# Patient Record
Sex: Male | Born: 1935
Health system: Southern US, Community
[De-identification: ages and names within clinical notes are randomized; demographics above are authoritative.]

## PROBLEM LIST (undated history)

## (undated) DIAGNOSIS — I350 Nonrheumatic aortic (valve) stenosis: Secondary | ICD-10-CM

## (undated) DIAGNOSIS — N529 Male erectile dysfunction, unspecified: Secondary | ICD-10-CM

## (undated) DIAGNOSIS — R55 Syncope and collapse: Secondary | ICD-10-CM

## (undated) DIAGNOSIS — R0609 Other forms of dyspnea: Secondary | ICD-10-CM

## (undated) DIAGNOSIS — Z961 Presence of intraocular lens: Secondary | ICD-10-CM

## (undated) DIAGNOSIS — M1990 Unspecified osteoarthritis, unspecified site: Secondary | ICD-10-CM

## (undated) DIAGNOSIS — R7881 Bacteremia: Secondary | ICD-10-CM

## (undated) DIAGNOSIS — Z8679 Personal history of other diseases of the circulatory system: Secondary | ICD-10-CM

## (undated) DIAGNOSIS — R972 Elevated prostate specific antigen [PSA]: Secondary | ICD-10-CM

## (undated) DIAGNOSIS — I469 Cardiac arrest, cause unspecified: Secondary | ICD-10-CM

## (undated) DIAGNOSIS — H3320 Serous retinal detachment, unspecified eye: Secondary | ICD-10-CM

## (undated) DIAGNOSIS — R9430 Abnormal result of cardiovascular function study, unspecified: Secondary | ICD-10-CM

## (undated) DIAGNOSIS — N401 Enlarged prostate with lower urinary tract symptoms: Secondary | ICD-10-CM

## (undated) DIAGNOSIS — I482 Chronic atrial fibrillation, unspecified: Secondary | ICD-10-CM

## (undated) DIAGNOSIS — I499 Cardiac arrhythmia, unspecified: Secondary | ICD-10-CM

## (undated) DIAGNOSIS — K769 Liver disease, unspecified: Secondary | ICD-10-CM

## (undated) DIAGNOSIS — Z8739 Personal history of other diseases of the musculoskeletal system and connective tissue: Secondary | ICD-10-CM

## (undated) DIAGNOSIS — I10 Essential (primary) hypertension: Secondary | ICD-10-CM

## (undated) DIAGNOSIS — B1920 Unspecified viral hepatitis C without hepatic coma: Secondary | ICD-10-CM

## (undated) DIAGNOSIS — H401190 Primary open-angle glaucoma, unspecified eye, stage unspecified: Secondary | ICD-10-CM

## (undated) DIAGNOSIS — I8290 Acute embolism and thrombosis of unspecified vein: Secondary | ICD-10-CM

## (undated) DIAGNOSIS — H35342 Macular cyst, hole, or pseudohole, left eye: Secondary | ICD-10-CM

## (undated) DIAGNOSIS — R7303 Prediabetes: Secondary | ICD-10-CM

## (undated) DIAGNOSIS — N400 Enlarged prostate without lower urinary tract symptoms: Secondary | ICD-10-CM

## (undated) DIAGNOSIS — I5022 Chronic systolic (congestive) heart failure: Secondary | ICD-10-CM

## (undated) DIAGNOSIS — E785 Hyperlipidemia, unspecified: Secondary | ICD-10-CM

## (undated) DIAGNOSIS — I214 Non-ST elevation (NSTEMI) myocardial infarction: Secondary | ICD-10-CM

## (undated) DIAGNOSIS — H269 Unspecified cataract: Secondary | ICD-10-CM

## (undated) DIAGNOSIS — Z952 Presence of prosthetic heart valve: Secondary | ICD-10-CM

## (undated) DIAGNOSIS — K219 Gastro-esophageal reflux disease without esophagitis: Secondary | ICD-10-CM

## (undated) DIAGNOSIS — S32000A Wedge compression fracture of unspecified lumbar vertebra, initial encounter for closed fracture: Secondary | ICD-10-CM

## (undated) DIAGNOSIS — I2510 Atherosclerotic heart disease of native coronary artery without angina pectoris: Secondary | ICD-10-CM

## (undated) DIAGNOSIS — R6889 Other general symptoms and signs: Secondary | ICD-10-CM

## (undated) DIAGNOSIS — Z95810 Presence of automatic (implantable) cardiac defibrillator: Secondary | ICD-10-CM

## (undated) DIAGNOSIS — N138 Other obstructive and reflux uropathy: Secondary | ICD-10-CM

## (undated) DIAGNOSIS — I255 Ischemic cardiomyopathy: Secondary | ICD-10-CM

## (undated) HISTORY — PX: CARDIAC CATHETERIZATION: SHX172

## (undated) HISTORY — PX: INSERT / REPLACE / REMOVE PACEMAKER: SUR710

## (undated) HISTORY — DX: Serous retinal detachment, unspecified eye: H33.20

## (undated) HISTORY — DX: Prediabetes: R73.03

## (undated) HISTORY — PX: CORONARY ARTERY BYPASS GRAFT: SHX141

## (undated) HISTORY — PX: CHOLECYSTECTOMY: SHX55

## (undated) HISTORY — DX: Gastro-esophageal reflux disease without esophagitis: K21.9

## (undated) HISTORY — DX: Primary open-angle glaucoma, unspecified eye, stage unspecified: H40.1190

## (undated) HISTORY — DX: Non-ST elevation (NSTEMI) myocardial infarction: I21.4

## (undated) HISTORY — PX: AV NODE ABLATION: EP1193

## (undated) HISTORY — PX: CORONARY ANGIOPLASTY WITH STENT PLACEMENT: SHX49

## (undated) HISTORY — DX: Presence of intraocular lens: Z96.1

## (undated) HISTORY — DX: Personal history of other diseases of the musculoskeletal system and connective tissue: Z87.39

## (undated) HISTORY — DX: Benign prostatic hyperplasia with lower urinary tract symptoms: N40.1

## (undated) HISTORY — DX: Unspecified osteoarthritis, unspecified site: M19.90

## (undated) HISTORY — DX: Unspecified viral hepatitis C without hepatic coma: B19.20

## (undated) HISTORY — DX: Nonrheumatic aortic (valve) stenosis: I35.0

## (undated) HISTORY — DX: Personal history of other diseases of the circulatory system: Z86.79

## (undated) HISTORY — DX: Cardiac arrhythmia, unspecified: I49.9

## (undated) HISTORY — DX: Elevated prostate specific antigen (PSA): R97.20

## (undated) HISTORY — DX: Unspecified cataract: H26.9

## (undated) HISTORY — DX: Male erectile dysfunction, unspecified: N52.9

## (undated) HISTORY — DX: Other general symptoms and signs: R68.89

## (undated) HISTORY — DX: Chronic systolic (congestive) heart failure: I50.22

## (undated) HISTORY — DX: Acute embolism and thrombosis of unspecified vein: I82.90

## (undated) HISTORY — PX: YAG LASER APPLICATION: SHX6189

## (undated) HISTORY — DX: Essential (primary) hypertension: I10

## (undated) HISTORY — DX: Other obstructive and reflux uropathy: N13.8

## (undated) HISTORY — PX: SKIN BIOPSY: SHX1

## (undated) HISTORY — DX: Bacteremia: R78.81

## (undated) HISTORY — DX: Macular cyst, hole, or pseudohole, left eye: H35.342

## (undated) HISTORY — DX: Wedge compression fracture of unspecified lumbar vertebra, initial encounter for closed fracture: S32.000A

## (undated) HISTORY — DX: Syncope and collapse: R55

## (undated) HISTORY — DX: Other forms of dyspnea: R06.09

## (undated) HISTORY — DX: Liver disease, unspecified: K76.9

## (undated) HISTORY — DX: Abnormal result of cardiovascular function study, unspecified: R94.30

## (undated) HISTORY — DX: Atherosclerotic heart disease of native coronary artery without angina pectoris: I25.10

## (undated) HISTORY — DX: Hyperlipidemia, unspecified: E78.5

## (undated) HISTORY — DX: Cardiac arrest, cause unspecified: I46.9

## (undated) HISTORY — PX: CATARACT EXTRACTION: SUR2

---

## 2011-06-03 DIAGNOSIS — I4891 Unspecified atrial fibrillation: Secondary | ICD-10-CM | POA: Insufficient documentation

## 2011-07-18 DIAGNOSIS — N401 Enlarged prostate with lower urinary tract symptoms: Secondary | ICD-10-CM | POA: Insufficient documentation

## 2011-07-18 DIAGNOSIS — R972 Elevated prostate specific antigen [PSA]: Secondary | ICD-10-CM | POA: Insufficient documentation

## 2012-02-11 DIAGNOSIS — I472 Ventricular tachycardia: Secondary | ICD-10-CM | POA: Insufficient documentation

## 2012-02-11 DIAGNOSIS — E785 Hyperlipidemia, unspecified: Secondary | ICD-10-CM | POA: Insufficient documentation

## 2012-02-11 DIAGNOSIS — I501 Left ventricular failure: Secondary | ICD-10-CM | POA: Insufficient documentation

## 2012-02-11 DIAGNOSIS — B182 Chronic viral hepatitis C: Secondary | ICD-10-CM | POA: Insufficient documentation

## 2012-02-11 DIAGNOSIS — I214 Non-ST elevation (NSTEMI) myocardial infarction: Secondary | ICD-10-CM | POA: Insufficient documentation

## 2012-03-01 DIAGNOSIS — H3320 Serous retinal detachment, unspecified eye: Secondary | ICD-10-CM | POA: Insufficient documentation

## 2012-04-04 DIAGNOSIS — N529 Male erectile dysfunction, unspecified: Secondary | ICD-10-CM | POA: Insufficient documentation

## 2012-11-07 DIAGNOSIS — Z7901 Long term (current) use of anticoagulants: Secondary | ICD-10-CM | POA: Insufficient documentation

## 2013-05-02 DIAGNOSIS — H401133 Primary open-angle glaucoma, bilateral, severe stage: Secondary | ICD-10-CM | POA: Insufficient documentation

## 2014-08-05 DIAGNOSIS — Z9889 Other specified postprocedural states: Secondary | ICD-10-CM | POA: Insufficient documentation

## 2015-04-01 DIAGNOSIS — R7881 Bacteremia: Secondary | ICD-10-CM | POA: Insufficient documentation

## 2015-08-12 DIAGNOSIS — T827XXA Infection and inflammatory reaction due to other cardiac and vascular devices, implants and grafts, initial encounter: Secondary | ICD-10-CM | POA: Insufficient documentation

## 2015-10-21 DIAGNOSIS — M4647 Discitis, unspecified, lumbosacral region: Secondary | ICD-10-CM | POA: Insufficient documentation

## 2015-10-21 DIAGNOSIS — M4856XA Collapsed vertebra, not elsewhere classified, lumbar region, initial encounter for fracture: Secondary | ICD-10-CM | POA: Insufficient documentation

## 2017-05-18 DIAGNOSIS — I2510 Atherosclerotic heart disease of native coronary artery without angina pectoris: Secondary | ICD-10-CM | POA: Insufficient documentation

## 2017-08-02 DIAGNOSIS — H35342 Macular cyst, hole, or pseudohole, left eye: Secondary | ICD-10-CM | POA: Insufficient documentation

## 2017-08-02 DIAGNOSIS — Z8669 Personal history of other diseases of the nervous system and sense organs: Secondary | ICD-10-CM | POA: Insufficient documentation

## 2017-08-02 DIAGNOSIS — Z961 Presence of intraocular lens: Secondary | ICD-10-CM | POA: Insufficient documentation

## 2017-12-13 DIAGNOSIS — H401133 Primary open-angle glaucoma, bilateral, severe stage: Secondary | ICD-10-CM | POA: Diagnosis not present

## 2018-01-08 DIAGNOSIS — I472 Ventricular tachycardia: Secondary | ICD-10-CM | POA: Diagnosis not present

## 2018-01-26 DIAGNOSIS — Z4502 Encounter for adjustment and management of automatic implantable cardiac defibrillator: Secondary | ICD-10-CM | POA: Diagnosis not present

## 2018-01-26 DIAGNOSIS — I255 Ischemic cardiomyopathy: Secondary | ICD-10-CM | POA: Diagnosis not present

## 2018-03-13 DIAGNOSIS — I2510 Atherosclerotic heart disease of native coronary artery without angina pectoris: Secondary | ICD-10-CM | POA: Diagnosis not present

## 2018-03-13 DIAGNOSIS — I482 Chronic atrial fibrillation: Secondary | ICD-10-CM | POA: Diagnosis not present

## 2018-03-13 DIAGNOSIS — I350 Nonrheumatic aortic (valve) stenosis: Secondary | ICD-10-CM | POA: Diagnosis not present

## 2018-03-13 DIAGNOSIS — Z951 Presence of aortocoronary bypass graft: Secondary | ICD-10-CM | POA: Diagnosis not present

## 2018-03-13 DIAGNOSIS — Z5181 Encounter for therapeutic drug level monitoring: Secondary | ICD-10-CM | POA: Diagnosis not present

## 2018-03-13 DIAGNOSIS — Z7901 Long term (current) use of anticoagulants: Secondary | ICD-10-CM | POA: Diagnosis not present

## 2018-03-21 DIAGNOSIS — H401133 Primary open-angle glaucoma, bilateral, severe stage: Secondary | ICD-10-CM | POA: Diagnosis not present

## 2018-04-09 DIAGNOSIS — I255 Ischemic cardiomyopathy: Secondary | ICD-10-CM | POA: Diagnosis not present

## 2018-04-09 DIAGNOSIS — Z4502 Encounter for adjustment and management of automatic implantable cardiac defibrillator: Secondary | ICD-10-CM | POA: Diagnosis not present

## 2018-04-18 DIAGNOSIS — I4891 Unspecified atrial fibrillation: Secondary | ICD-10-CM | POA: Diagnosis not present

## 2018-04-18 DIAGNOSIS — Z7901 Long term (current) use of anticoagulants: Secondary | ICD-10-CM | POA: Diagnosis not present

## 2018-04-18 DIAGNOSIS — I739 Peripheral vascular disease, unspecified: Secondary | ICD-10-CM | POA: Diagnosis not present

## 2018-04-18 DIAGNOSIS — H409 Unspecified glaucoma: Secondary | ICD-10-CM | POA: Diagnosis not present

## 2018-04-18 DIAGNOSIS — I252 Old myocardial infarction: Secondary | ICD-10-CM | POA: Diagnosis not present

## 2018-04-18 DIAGNOSIS — I110 Hypertensive heart disease with heart failure: Secondary | ICD-10-CM | POA: Diagnosis not present

## 2018-04-18 DIAGNOSIS — I509 Heart failure, unspecified: Secondary | ICD-10-CM | POA: Diagnosis not present

## 2018-04-18 DIAGNOSIS — I2510 Atherosclerotic heart disease of native coronary artery without angina pectoris: Secondary | ICD-10-CM | POA: Diagnosis not present

## 2018-04-18 DIAGNOSIS — R69 Illness, unspecified: Secondary | ICD-10-CM | POA: Diagnosis not present

## 2018-04-18 DIAGNOSIS — E785 Hyperlipidemia, unspecified: Secondary | ICD-10-CM | POA: Diagnosis not present

## 2018-06-05 DIAGNOSIS — H52209 Unspecified astigmatism, unspecified eye: Secondary | ICD-10-CM | POA: Diagnosis not present

## 2018-06-05 DIAGNOSIS — H5213 Myopia, bilateral: Secondary | ICD-10-CM | POA: Diagnosis not present

## 2018-06-05 DIAGNOSIS — H31093 Other chorioretinal scars, bilateral: Secondary | ICD-10-CM | POA: Diagnosis not present

## 2018-06-05 DIAGNOSIS — H2511 Age-related nuclear cataract, right eye: Secondary | ICD-10-CM | POA: Diagnosis not present

## 2018-06-05 DIAGNOSIS — H524 Presbyopia: Secondary | ICD-10-CM | POA: Diagnosis not present

## 2018-06-05 DIAGNOSIS — Z8669 Personal history of other diseases of the nervous system and sense organs: Secondary | ICD-10-CM | POA: Diagnosis not present

## 2018-06-05 DIAGNOSIS — R69 Illness, unspecified: Secondary | ICD-10-CM | POA: Diagnosis not present

## 2018-06-05 DIAGNOSIS — Z961 Presence of intraocular lens: Secondary | ICD-10-CM | POA: Diagnosis not present

## 2018-06-05 DIAGNOSIS — H401133 Primary open-angle glaucoma, bilateral, severe stage: Secondary | ICD-10-CM | POA: Diagnosis not present

## 2018-06-07 DIAGNOSIS — Z951 Presence of aortocoronary bypass graft: Secondary | ICD-10-CM | POA: Diagnosis not present

## 2018-06-07 DIAGNOSIS — I4891 Unspecified atrial fibrillation: Secondary | ICD-10-CM | POA: Diagnosis not present

## 2018-06-07 DIAGNOSIS — I5022 Chronic systolic (congestive) heart failure: Secondary | ICD-10-CM | POA: Diagnosis not present

## 2018-06-07 DIAGNOSIS — I110 Hypertensive heart disease with heart failure: Secondary | ICD-10-CM | POA: Diagnosis not present

## 2018-06-07 DIAGNOSIS — E785 Hyperlipidemia, unspecified: Secondary | ICD-10-CM | POA: Diagnosis not present

## 2018-06-07 DIAGNOSIS — I2510 Atherosclerotic heart disease of native coronary artery without angina pectoris: Secondary | ICD-10-CM | POA: Diagnosis not present

## 2018-06-07 DIAGNOSIS — I350 Nonrheumatic aortic (valve) stenosis: Secondary | ICD-10-CM | POA: Diagnosis not present

## 2018-07-02 DIAGNOSIS — I255 Ischemic cardiomyopathy: Secondary | ICD-10-CM | POA: Diagnosis not present

## 2018-07-02 DIAGNOSIS — Z4502 Encounter for adjustment and management of automatic implantable cardiac defibrillator: Secondary | ICD-10-CM | POA: Diagnosis not present

## 2018-07-06 DIAGNOSIS — R69 Illness, unspecified: Secondary | ICD-10-CM | POA: Diagnosis not present

## 2018-07-24 DIAGNOSIS — Z7901 Long term (current) use of anticoagulants: Secondary | ICD-10-CM | POA: Diagnosis not present

## 2018-07-24 DIAGNOSIS — I4811 Longstanding persistent atrial fibrillation: Secondary | ICD-10-CM | POA: Diagnosis not present

## 2018-07-24 DIAGNOSIS — Z5181 Encounter for therapeutic drug level monitoring: Secondary | ICD-10-CM | POA: Diagnosis not present

## 2018-08-20 DIAGNOSIS — I4811 Longstanding persistent atrial fibrillation: Secondary | ICD-10-CM | POA: Diagnosis not present

## 2018-08-20 DIAGNOSIS — Z5181 Encounter for therapeutic drug level monitoring: Secondary | ICD-10-CM | POA: Diagnosis not present

## 2018-08-20 DIAGNOSIS — Z7901 Long term (current) use of anticoagulants: Secondary | ICD-10-CM | POA: Diagnosis not present

## 2018-08-22 DIAGNOSIS — H401133 Primary open-angle glaucoma, bilateral, severe stage: Secondary | ICD-10-CM | POA: Diagnosis not present

## 2018-08-22 DIAGNOSIS — Z7901 Long term (current) use of anticoagulants: Secondary | ICD-10-CM | POA: Diagnosis not present

## 2018-08-22 DIAGNOSIS — Z5181 Encounter for therapeutic drug level monitoring: Secondary | ICD-10-CM | POA: Diagnosis not present

## 2018-08-22 DIAGNOSIS — I4811 Longstanding persistent atrial fibrillation: Secondary | ICD-10-CM | POA: Diagnosis not present

## 2018-08-28 DIAGNOSIS — Z7901 Long term (current) use of anticoagulants: Secondary | ICD-10-CM | POA: Diagnosis not present

## 2018-08-28 DIAGNOSIS — Z5181 Encounter for therapeutic drug level monitoring: Secondary | ICD-10-CM | POA: Diagnosis not present

## 2018-08-28 DIAGNOSIS — I4811 Longstanding persistent atrial fibrillation: Secondary | ICD-10-CM | POA: Diagnosis not present

## 2018-09-07 DIAGNOSIS — Z5181 Encounter for therapeutic drug level monitoring: Secondary | ICD-10-CM | POA: Diagnosis not present

## 2018-09-07 DIAGNOSIS — I4811 Longstanding persistent atrial fibrillation: Secondary | ICD-10-CM | POA: Diagnosis not present

## 2018-09-07 DIAGNOSIS — Z7901 Long term (current) use of anticoagulants: Secondary | ICD-10-CM | POA: Diagnosis not present

## 2018-09-18 DIAGNOSIS — R05 Cough: Secondary | ICD-10-CM | POA: Diagnosis not present

## 2018-09-18 DIAGNOSIS — I4811 Longstanding persistent atrial fibrillation: Secondary | ICD-10-CM | POA: Diagnosis not present

## 2018-09-18 DIAGNOSIS — M4856XS Collapsed vertebra, not elsewhere classified, lumbar region, sequela of fracture: Secondary | ICD-10-CM | POA: Diagnosis not present

## 2018-09-18 DIAGNOSIS — I2510 Atherosclerotic heart disease of native coronary artery without angina pectoris: Secondary | ICD-10-CM | POA: Diagnosis not present

## 2018-09-18 DIAGNOSIS — Z7901 Long term (current) use of anticoagulants: Secondary | ICD-10-CM | POA: Diagnosis not present

## 2018-09-18 DIAGNOSIS — I4891 Unspecified atrial fibrillation: Secondary | ICD-10-CM | POA: Diagnosis not present

## 2018-09-18 DIAGNOSIS — Z5181 Encounter for therapeutic drug level monitoring: Secondary | ICD-10-CM | POA: Diagnosis not present

## 2018-09-18 DIAGNOSIS — Z0000 Encounter for general adult medical examination without abnormal findings: Secondary | ICD-10-CM | POA: Diagnosis not present

## 2018-09-18 DIAGNOSIS — R413 Other amnesia: Secondary | ICD-10-CM | POA: Diagnosis not present

## 2018-09-18 DIAGNOSIS — I350 Nonrheumatic aortic (valve) stenosis: Secondary | ICD-10-CM | POA: Diagnosis not present

## 2018-10-01 DIAGNOSIS — Z7901 Long term (current) use of anticoagulants: Secondary | ICD-10-CM | POA: Diagnosis not present

## 2018-10-01 DIAGNOSIS — I4891 Unspecified atrial fibrillation: Secondary | ICD-10-CM | POA: Diagnosis not present

## 2018-10-01 DIAGNOSIS — I255 Ischemic cardiomyopathy: Secondary | ICD-10-CM | POA: Diagnosis not present

## 2018-10-01 DIAGNOSIS — Z4502 Encounter for adjustment and management of automatic implantable cardiac defibrillator: Secondary | ICD-10-CM | POA: Diagnosis not present

## 2018-10-01 DIAGNOSIS — Z5181 Encounter for therapeutic drug level monitoring: Secondary | ICD-10-CM | POA: Diagnosis not present

## 2018-10-03 DIAGNOSIS — R05 Cough: Secondary | ICD-10-CM | POA: Diagnosis not present

## 2018-10-30 DIAGNOSIS — Z7901 Long term (current) use of anticoagulants: Secondary | ICD-10-CM | POA: Diagnosis not present

## 2018-10-30 DIAGNOSIS — Z5181 Encounter for therapeutic drug level monitoring: Secondary | ICD-10-CM | POA: Diagnosis not present

## 2018-10-30 DIAGNOSIS — I4891 Unspecified atrial fibrillation: Secondary | ICD-10-CM | POA: Diagnosis not present

## 2018-11-05 ENCOUNTER — Encounter (INDEPENDENT_AMBULATORY_CARE_PROVIDER_SITE_OTHER): Payer: Self-pay

## 2018-11-05 ENCOUNTER — Encounter: Payer: Self-pay | Admitting: Cardiovascular Disease

## 2018-11-05 ENCOUNTER — Ambulatory Visit (INDEPENDENT_AMBULATORY_CARE_PROVIDER_SITE_OTHER): Payer: Medicare HMO | Admitting: Cardiovascular Disease

## 2018-11-05 VITALS — BP 132/76 | HR 92 | Ht 66.0 in | Wt 178.4 lb

## 2018-11-05 DIAGNOSIS — I350 Nonrheumatic aortic (valve) stenosis: Secondary | ICD-10-CM | POA: Diagnosis not present

## 2018-11-05 NOTE — Progress Notes (Signed)
Chief Complaint  Patient presents with  . New Patient (Initial Visit)    Severe aortic stenosis   History of Present Illness: 84 yo male with history of arthritis, ischemic cardiomyopathy, CAD, GERD, HTN, hyperlipidemia, chronic systolic CHF, paroxysmal atrial fibrillation and severe aortic stenosis here today as a new patient for further discussion regarding his aortic stenosis and possible TAVR. He is known to have severe aortic stenosis. He underwent TAVR workup at [facility] in 2015 but ultimately he did not proceed with TAVR as he was told it may not improve his symptoms. He has been followed in the [place] in [place], [place] since then. Echo August 2019 with LVEF=35-40%. The aortic valve leaflets were thickened and calcified. Mean gradient 27 mmHg. AVA 0.44 cm2, Dimensionless index 0.15. He is felt to have low flow, low gradient aortic stenosis. He is known to have CAD. He underwent 4V CABG in 1994 (LIMA to LAD, SVG to ramus, SVG to Diagonal, SVG to PDA). PCI of the first obtuse marginal in 2014 with placement of a 2.5 x 16 mm Promus drug eluting stent. Most recent cardiac cath February 2018 with 4/4 patent grafts with high grade disease in the SVG to the ramus intermediate treated with a drug eluting stent. He has paroxysmal atrial fibrillation and has undergone ablation. He has had non-sustained ventricular tachycardia and an ICD is in place. This was replaced in 2016 due to enterococcal bacteremia. He has chronic systolic CHF, HTN, hyperlipidemia and GERD. He has been followed at [facility] by Dr. Adella Hare and was last seen there in October 2019. He was told then that it was not clear if a valve replacement would improve his symptoms so he elected not to proceed.   He tells me today that he has no chest pain. He has dyspnea with minimal exertion. He has dizziness and progressive fatigue with no near syncope or syncope. No lower extremity edema or recent weight gain. He  goes to the dentist every six months. No active dental issues. He lives in a house in [place] with his wife.   Primary Care Physician: Day, Pernell Dupre, MD  Primary Cardiologist: Dr. [facility]) TAVR Cardiologist: Dr. Adella Hare    Past Medical History:  Diagnosis Date  . Aortic stenosis   . Arrhythmia    paroxysmal atrial fibrillation  . Arthritis   . Bacteremia   . Cardiac arrest (HCC)   . Cardiac LV ejection fraction 30-35%   . Cataract   . Compression of lumbar vertebra (HCC)   . Coronary artery disease   . Dyspnea on exertion   . ED (erectile dysfunction)   . Elevated prostate specific antigen (PSA)   . GERD (gastroesophageal reflux disease)   . Glaucoma primary, open angle    BOTH EYES  . H/O ventricular tachycardia   . Hepatitis C virus infection   . History of discitis   . Hyperlipidemia   . Hypertension   . Hypertrophy of prostate with urinary obstruction    AND LOWER URINARY TRACT SYMPTOMS  . Hypokinesis   . Liver disease   . Macular hole of left eye   . Non-ST elevation MI (NSTEMI) (HCC)   . Pre-diabetes   . Pseudophakia of left eye   . Retinal detachment   . Syncope   . Systolic CHF, chronic (HCC)   . Thrombus    laa    Past Surgical History:  Procedure Laterality Date  . AV NODE ABLATION    .  CARDIAC CATHETERIZATION    . CATARACT EXTRACTION    . CHOLECYSTECTOMY    . CORONARY ANGIOPLASTY WITH STENT PLACEMENT    . CORONARY ARTERY BYPASS GRAFT    . INSERT / REPLACE / REMOVE PACEMAKER    . SKIN BIOPSY    . YAG LASER APPLICATION      Current Outpatient Medications  Medication Sig Dispense Refill  . aspirin EC 81 MG tablet Take 81 mg by mouth daily.    . brimonidine (ALPHAGAN) 0.2 % ophthalmic solution Place 1 drop into both eyes 2 (two) times daily.    . brinzolamide (AZOPT) 1 % ophthalmic suspension Place 1 drop into both eyes 3 (three) times daily.    . carvedilol (COREG) 25 MG tablet Take 2.5 mg by mouth 2 (two) times daily with a  meal.    . Cholecalciferol (VITAMIN D) 125 MCG (5000 UT) CAPS Take 1 capsule by mouth every other day.    . dorzolamide (TRUSOPT) 2 % ophthalmic solution Place 1 drop into both eyes 3 (three) times daily.    . famotidine (PEPCID) 20 MG tablet Take 20 mg by mouth daily.    . furosemide (LASIX) 40 MG tablet Take 40 mg by mouth daily.    Marland Kitchen latanoprost (XALATAN) 0.005 % ophthalmic solution Place 1 drop into both eyes at bedtime.    Marland Kitchen lisinopril (PRINIVIL,ZESTRIL) 40 MG tablet Take 40 mg by mouth daily.    . magnesium gluconate (MAGONATE) 500 MG tablet Take 500 mg by mouth every other day.    . Multiple Vitamin (MULTIVITAMIN) tablet Take 1 tablet by mouth daily.    . nitroGLYCERIN (NITROSTAT) 0.4 MG SL tablet Place 0.4 mg under the tongue every 5 (five) minutes as needed for chest pain.    . pravastatin (PRAVACHOL) 40 MG tablet Take 40 mg by mouth at bedtime.    . ranolazine (RANEXA) 500 MG 12 hr tablet Take 500 mg by mouth 2 (two) times daily.    Marland Kitchen warfarin (COUMADIN) 5 MG tablet 5 mg. TAKE  ONE TABLET BY MOUTH EVERY EVENING EXCEPT, ON MONDAY, [facility] AND Friday TAKE AS DIRECTED     No current facility-administered medications for this visit.     Allergies  Allergen Reactions  . Anesthetics, Ester Other (See Comments)    GI UPSET    Social History   Socioeconomic History  . Marital status: Married    Spouse name: Not on file  . Number of children: Not on file  . Years of education: Not on file  . Highest education level: Not on file  Occupational History  . Occupation: Retired-accountant  Social Needs  . Financial resource strain: Not on file  . Food insecurity:    Worry: Not on file    Inability: Not on file  . Transportation needs:    Medical: Not on file    Non-medical: Not on file  Tobacco Use  . Smoking status: Former Smoker    Packs/day: 2.00    Years: 40.00    Pack years: 80.00    Types: Cigarettes    Last attempt to quit: 09/06/1983    Years since quitting: 35.1    . Smokeless tobacco: Never Used  Substance and Sexual Activity  . Alcohol use: Yes    Alcohol/week: 2.0 standard drinks    Types: 1 Glasses of wine, 1 Cans of beer per week  . Drug use: Not on file  . Sexual activity: Not on file  Lifestyle  . Physical activity:  Days per week: Not on file    Minutes per session: Not on file  . Stress: Not on file  Relationships  . Social connections:    Talks on phone: Not on file    Gets together: Not on file    Attends religious service: Not on file    Active member of club or organization: Not on file    Attends meetings of clubs or organizations: Not on file    Relationship status: Not on file  . Intimate partner violence:    Fear of current or ex partner: Not on file    Emotionally abused: Not on file    Physically abused: Not on file    Forced sexual activity: Not on file  Other Topics Concern  . Not on file  Social History Narrative  . Not on file    Family History  Problem Relation Age of Onset  . Cancer - Other Sister        BREAST    Review of Systems:  As stated in the HPI and otherwise negative.   BP 132/76   Pulse 92   Ht  (1.676 m)   Wt 80.9 kg   SpO2 98%   BMI 28.79 kg/m   Physical Examination: General: Well developed, well nourished, NAD  HEENT: OP clear, mucus membranes moist  SKIN: warm, dry. No rashes. Neuro: No focal deficits  Musculoskeletal: Muscle strength 5/5 all ext  Psychiatric: Mood and affect normal  Neck: No JVD, no carotid bruits, no thyromegaly, no lymphadenopathy.  Lungs:Clear bilaterally, no wheezes, rhonci, crackles Cardiovascular: Regular rate and rhythm. Loud, harsh systolic murmur at the RUSB Abdomen:Soft. Bowel sounds present. Non-tender.  Extremities: No lower extremity edema. Pulses are 2 + in the bilateral DP/PT.  Echo August 2019:  LVEF=35-40%.  Mildly dilated LV BAE Severe AS (mean gradient 27 mmHg with DVI 0.15, AVA 0.44cm2.   EKG:  EKG is ordered today. The ekg  ordered today demonstrates v paced  Recent Labs: No results found for requested labs within last 8760 hours.   Lipid Panel No results found for: CHOL, TRIG, HDL, CHOLHDL, VLDL, LDLCALC, LDLDIRECT   Wt Readings from Last 3 Encounters:  11/05/18 80.9 kg     Other studies Reviewed: Additional studies/ records that were reviewed today include: echo images, records from the [place] and [facility].  Review of the above records demonstrates: severe low flow, lwo gradient AS   Assessment and Plan:   1. Severe aortic stenosis: He has severe, stage D aortic valve stenosis. I have personally reviewed the echo images. The aortic valve is thickened, calcified with limited leaflet mobility. His mean gradient is only 27 mmHg but the valve appears to be severely diseased. The dimensionless indexI is 0.15 and the AVA is 0.44 cm2. I think he has low flow, low gradient severe aortic stenosis. I think he would benefit from AVR. Given advanced age, he is not a good candidate for conventional AVR by surgical approach. I think he may be a good candidate for TAVR.   STS Risk score:  Risk of Mortality: 5.407% Renal Failure: 6.472% Permanent Stroke: 1.923% Prolonged Ventilation: 16.937% DSW Infection: 0.263% Reoperation: 6.148% Morbidity or Mortality: 23.504% Short Length of Stay: 16.129% Long Length of Stay: 12.995%   I have reviewed the natural history of aortic stenosis with the patient and their family members  who are present today. We have discussed the limitations of medical therapy and the poor prognosis associated with symptomatic aortic  stenosis. We have reviewed potential treatment options, including palliative medical therapy, conventional surgical aortic valve replacement, and transcatheter aortic valve replacement. We discussed treatment options in the context of the patient's specific comorbid medical conditions.   He would like to discuss this further with his family before proceeding with planning for  TAVR. If he elects to proceed with TAVR workup, he will need a right and left heart catheterization and a same day echo. Risks and benefits of the cath procedure and the TAVR procedure are reviewed with the patient. After the cath, he will have a gated cardiac CT, CTA of the chest/abdomen and pelvis, PFTs, carotid dopplers and PT assessment and will then be referred to see one of the CT surgeons on our TAVR team.    Current medicines are reviewed at length with the patient today.  The patient does not have concerns regarding medicines.  The following changes have been made:  no change  Labs/ tests ordered today include:   Orders Placed This Encounter  Procedures  . EKG 12-Lead     Disposition:   F/U with the valve team. He will call back to set up his cath and echo.    Signed, Verne Carrow, MD 11/05/2018 4:44 PM    [facility] [address] [place], [place], [place]  [address] Phone: [phone]; Fax: [phone]

## 2018-11-05 NOTE — H&P (View-Only) (Signed)
Chief Complaint  Patient presents with  . New Patient (Initial Visit)    Severe aortic stenosis   History of Present Illness: 84 yo male with history of arthritis, ischemic cardiomyopathy, CAD, GERD, HTN, hyperlipidemia, chronic systolic CHF, paroxysmal atrial fibrillation and severe aortic stenosis here today as a new patient for further discussion regarding his aortic stenosis and possible TAVR. He is known to have severe aortic stenosis. He underwent TAVR workup at [facility] in 2015 but ultimately he did not proceed with TAVR as he was told it may not improve his symptoms. He has been followed in the [place] in [place], [place] since then. Echo August 2019 with LVEF=35-40%. The aortic valve leaflets were thickened and calcified. Mean gradient 27 mmHg. AVA 0.44 cm2, Dimensionless index 0.15. He is felt to have low flow, low gradient aortic stenosis. He is known to have CAD. He underwent 4V CABG in 1994 (LIMA to LAD, SVG to ramus, SVG to Diagonal, SVG to PDA). PCI of the first obtuse marginal in 2014 with placement of a 2.5 x 16 mm Promus drug eluting stent. Most recent cardiac cath February 2018 with 4/4 patent grafts with high grade disease in the SVG to the ramus intermediate treated with a drug eluting stent. He has paroxysmal atrial fibrillation and has undergone ablation. He has had non-sustained ventricular tachycardia and an ICD is in place. This was replaced in 2016 due to enterococcal bacteremia. He has chronic systolic CHF, HTN, hyperlipidemia and GERD. He has been followed at [facility] by Dr. Adella Hare and was last seen there in October 2019. He was told then that it was not clear if a valve replacement would improve his symptoms so he elected not to proceed.   He tells me today that he has no chest pain. He has dyspnea with minimal exertion. He has dizziness and progressive fatigue with no near syncope or syncope. No lower extremity edema or recent weight gain. He  goes to the dentist every six months. No active dental issues. He lives in a house in [place] with his wife.   Primary Care Physician: Day, Pernell Dupre, MD  Primary Cardiologist: Dr. [facility]) TAVR Cardiologist: Dr. Adella Hare    Past Medical History:  Diagnosis Date  . Aortic stenosis   . Arrhythmia    paroxysmal atrial fibrillation  . Arthritis   . Bacteremia   . Cardiac arrest (HCC)   . Cardiac LV ejection fraction 30-35%   . Cataract   . Compression of lumbar vertebra (HCC)   . Coronary artery disease   . Dyspnea on exertion   . ED (erectile dysfunction)   . Elevated prostate specific antigen (PSA)   . GERD (gastroesophageal reflux disease)   . Glaucoma primary, open angle    BOTH EYES  . H/O ventricular tachycardia   . Hepatitis C virus infection   . History of discitis   . Hyperlipidemia   . Hypertension   . Hypertrophy of prostate with urinary obstruction    AND LOWER URINARY TRACT SYMPTOMS  . Hypokinesis   . Liver disease   . Macular hole of left eye   . Non-ST elevation MI (NSTEMI) (HCC)   . Pre-diabetes   . Pseudophakia of left eye   . Retinal detachment   . Syncope   . Systolic CHF, chronic (HCC)   . Thrombus    laa    Past Surgical History:  Procedure Laterality Date  . AV NODE ABLATION    .  CARDIAC CATHETERIZATION    . CATARACT EXTRACTION    . CHOLECYSTECTOMY    . CORONARY ANGIOPLASTY WITH STENT PLACEMENT    . CORONARY ARTERY BYPASS GRAFT    . INSERT / REPLACE / REMOVE PACEMAKER    . SKIN BIOPSY    . YAG LASER APPLICATION      Current Outpatient Medications  Medication Sig Dispense Refill  . aspirin EC 81 MG tablet Take 81 mg by mouth daily.    . brimonidine (ALPHAGAN) 0.2 % ophthalmic solution Place 1 drop into both eyes 2 (two) times daily.    . brinzolamide (AZOPT) 1 % ophthalmic suspension Place 1 drop into both eyes 3 (three) times daily.    . carvedilol (COREG) 25 MG tablet Take 2.5 mg by mouth 2 (two) times daily with a  meal.    . Cholecalciferol (VITAMIN D) 125 MCG (5000 UT) CAPS Take 1 capsule by mouth every other day.    . dorzolamide (TRUSOPT) 2 % ophthalmic solution Place 1 drop into both eyes 3 (three) times daily.    . famotidine (PEPCID) 20 MG tablet Take 20 mg by mouth daily.    . furosemide (LASIX) 40 MG tablet Take 40 mg by mouth daily.    . latanoprost (XALATAN) 0.005 % ophthalmic solution Place 1 drop into both eyes at bedtime.    . lisinopril (PRINIVIL,ZESTRIL) 40 MG tablet Take 40 mg by mouth daily.    . magnesium gluconate (MAGONATE) 500 MG tablet Take 500 mg by mouth every other day.    . Multiple Vitamin (MULTIVITAMIN) tablet Take 1 tablet by mouth daily.    . nitroGLYCERIN (NITROSTAT) 0.4 MG SL tablet Place 0.4 mg under the tongue every 5 (five) minutes as needed for chest pain.    . pravastatin (PRAVACHOL) 40 MG tablet Take 40 mg by mouth at bedtime.    . ranolazine (RANEXA) 500 MG 12 hr tablet Take 500 mg by mouth 2 (two) times daily.    . warfarin (COUMADIN) 5 MG tablet 5 mg. TAKE  ONE TABLET BY MOUTH EVERY EVENING EXCEPT, ON MONDAY, WEDNESDAY AND Friday TAKE AS DIRECTED     No current facility-administered medications for this visit.     Allergies  Allergen Reactions  . Anesthetics, Ester Other (See Comments)    GI UPSET    Social History   Socioeconomic History  . Marital status: Married    Spouse name: Not on file  . Number of children: Not on file  . Years of education: Not on file  . Highest education level: Not on file  Occupational History  . Occupation: Retired-accountant  Social Needs  . Financial resource strain: Not on file  . Food insecurity:    Worry: Not on file    Inability: Not on file  . Transportation needs:    Medical: Not on file    Non-medical: Not on file  Tobacco Use  . Smoking status: Former Smoker    Packs/day: 2.00    Years: 40.00    Pack years: 80.00    Types: Cigarettes    Last attempt to quit: 09/06/1983    Years since quitting: 35.1    . Smokeless tobacco: Never Used  Substance and Sexual Activity  . Alcohol use: Yes    Alcohol/week: 2.0 standard drinks    Types: 1 Glasses of wine, 1 Cans of beer per week  . Drug use: Not on file  . Sexual activity: Not on file  Lifestyle  . Physical activity:      Days per week: Not on file    Minutes per session: Not on file  . Stress: Not on file  Relationships  . Social connections:    Talks on phone: Not on file    Gets together: Not on file    Attends religious service: Not on file    Active member of club or organization: Not on file    Attends meetings of clubs or organizations: Not on file    Relationship status: Not on file  . Intimate partner violence:    Fear of current or ex partner: Not on file    Emotionally abused: Not on file    Physically abused: Not on file    Forced sexual activity: Not on file  Other Topics Concern  . Not on file  Social History Narrative  . Not on file    Family History  Problem Relation Age of Onset  . Cancer - Other Sister        BREAST    Review of Systems:  As stated in the HPI and otherwise negative.   BP 132/76   Pulse 92   Ht 5' 6" (1.676 m)   Wt 80.9 kg   SpO2 98%   BMI 28.79 kg/m   Physical Examination: General: Well developed, well nourished, NAD  HEENT: OP clear, mucus membranes moist  SKIN: warm, dry. No rashes. Neuro: No focal deficits  Musculoskeletal: Muscle strength 5/5 all ext  Psychiatric: Mood and affect normal  Neck: No JVD, no carotid bruits, no thyromegaly, no lymphadenopathy.  Lungs:Clear bilaterally, no wheezes, rhonci, crackles Cardiovascular: Regular rate and rhythm. Loud, harsh systolic murmur at the RUSB Abdomen:Soft. Bowel sounds present. Non-tender.  Extremities: No lower extremity edema. Pulses are 2 + in the bilateral DP/PT.  Echo August 2019:  LVEF=35-40%.  Mildly dilated LV BAE Severe AS (mean gradient 27 mmHg with DVI 0.15, AVA 0.44cm2.   EKG:  EKG is ordered today. The ekg  ordered today demonstrates v paced  Recent Labs: No results found for requested labs within last 8760 hours.   Lipid Panel No results found for: CHOL, TRIG, HDL, CHOLHDL, VLDL, LDLCALC, LDLDIRECT   Wt Readings from Last 3 Encounters:  11/05/18 80.9 kg     Other studies Reviewed: Additional studies/ records that were reviewed today include: [facility] images, [facility].  Review of the above records demonstrates: severe low flow, lwo gradient AS   Assessment and Plan:   1. Severe aortic stenosis: He has severe, stage D aortic valve stenosis. I have personally reviewed the echo images. The aortic valve is thickened, calcified with limited leaflet mobility. His mean gradient is only 27 mmHg but the valve appears to be severely diseased. The dimensionless indexI is 0.15 and the AVA is 0.44 cm2. I think he has low flow, low gradient severe aortic stenosis. I think he would benefit from AVR. Given advanced age, he is not a good candidate for conventional AVR by surgical approach. I think he may be a good candidate for TAVR.   STS Risk score:  Risk of Mortality: 5.407% Renal Failure: 6.472% Permanent Stroke: 1.923% Prolonged Ventilation: 16.937% DSW Infection: 0.263% Reoperation: 6.148% Morbidity or Mortality: 23.504% Short Length of Stay: 16.129% Long Length of Stay: 12.995%   I have reviewed the natural history of aortic stenosis with the patient and their family members  who are present today. We have discussed the limitations of medical therapy and the poor prognosis associated with symptomatic aortic   stenosis. We have reviewed potential treatment options, including palliative medical therapy, conventional surgical aortic valve replacement, and transcatheter aortic valve replacement. We discussed treatment options in the context of the patient's specific comorbid medical conditions.   He would like to discuss this further with his family before proceeding with planning for  TAVR. If he elects to proceed with TAVR workup, he will need a right and left heart catheterization and a same day echo. Risks and benefits of the cath procedure and the TAVR procedure are reviewed with the patient. After the cath, he will have a gated cardiac CT, CTA of the chest/abdomen and pelvis, PFTs, carotid dopplers and PT assessment and will then be referred to see one of the CT surgeons on our TAVR team.    Current medicines are reviewed at length with the patient today.  The patient does not have concerns regarding medicines.  The following changes have been made:  no change  Labs/ tests ordered today include:   Orders Placed This Encounter  Procedures  . EKG 12-Lead     Disposition:   F/U with the valve team. He will call back to set up his cath and echo.    Signed, Verne Carrow, MD 11/05/2018 4:44 PM    [facility] [address] [place], [place], [place]  [address] Phone: [phone]; Fax: [phone]

## 2018-11-05 NOTE — Patient Instructions (Signed)
Medication Instructions:  Your physician recommends that you continue on your current medications as directed. Please refer to the Current Medication list given to you today.  If you need a refill on your cardiac medications before your next appointment, please call your pharmacy.   Lab work: none If you have labs (blood work) drawn today and your tests are completely normal, you will receive your results only by: Marland Kitchen MyChart Message (if you have MyChart) OR . A paper copy in the mail If you have any lab test that is abnormal or we need to change your treatment, we will call you to review the results.  Testing/Procedures: none  Follow-Up: Call us if you like to proceed with TAVR evaluation

## 2018-11-08 NOTE — Addendum Note (Signed)
Addended by: Verne Carrow D on: 11/08/2018 10:47 AM   Modules accepted: Orders, SmartSet

## 2018-11-16 DIAGNOSIS — I4891 Unspecified atrial fibrillation: Secondary | ICD-10-CM | POA: Diagnosis not present

## 2018-11-16 DIAGNOSIS — R69 Illness, unspecified: Secondary | ICD-10-CM | POA: Diagnosis not present

## 2018-11-16 DIAGNOSIS — Z5181 Encounter for therapeutic drug level monitoring: Secondary | ICD-10-CM | POA: Diagnosis not present

## 2018-11-16 DIAGNOSIS — Z7901 Long term (current) use of anticoagulants: Secondary | ICD-10-CM | POA: Diagnosis not present

## 2018-11-21 ENCOUNTER — Telehealth: Payer: Self-pay | Admitting: *Deleted

## 2018-11-21 ENCOUNTER — Telehealth: Payer: Self-pay

## 2018-11-21 NOTE — Telephone Encounter (Signed)
Pt contacted pre-catheterization scheduled at [facility] for: Friday November 23, 2018 9 AM Verified arrival time and place: [facility] Main Entrance A at: 6 AM for pre procedure lab  No solid food after midnight prior to cath, clear liquids until 5 AM day of procedure.  Hold: Coumadin-11/17/18 until post procedure. Lasix-day before and day of procedure. Lisinopril -day before and day of procedure.  Except hold medications AM meds can be  taken pre-cath with sip of water including: ASA 81 mg  Confirmed patient has responsible person to drive home post procedure and observe 24 hours after arriving home: yes  1.Have you been in contact with someone that was confirmed or suspected to have Covid 19 virus? no 2.Do you have any of the following symptoms-cough,fever, or shortness of breath? no 3. Have you travelled internationally in the last month? no  Please be advised that [facility] is restricting visitors at this time and request that only patients present for check-in prior to their appointment.  If necessary, only one visitor may come with you into the building. All visitors should remain in the waiting area.  For everyone's safety, all patients and visitors entering our practice area should expect to be screened again prior to entering our waiting area.  I reviewed instructions for cath, screening questions, and visitor information with patient's wife, Waynetta Sandy.

## 2018-11-21 NOTE — Telephone Encounter (Signed)
  [facility]   MULTIDISCIPLINARY HEART VALVE TEAM  Pt scheduled for Cardiac Catheterization on 11/23/2018 with Dr Clifton James as part of pre TAVR evaluation. Pt has severe symptomatic aortic stenosis and the team is okay with proceeding as scheduled as long as the pt is aware of potential risk of exposure to Covid-19.  Pt aware and would like to proceed so that additional data can be obtained and the pt can be triaged in regards to how we proceed with additional TAVR evaluation.

## 2018-11-23 ENCOUNTER — Ambulatory Visit (HOSPITAL_COMMUNITY)
Admission: RE | Admit: 2018-11-23 | Discharge: 2018-11-23 | Disposition: A | Discharge status: Home / Self Care | Payer: No Typology Code available for payment source | Attending: Cardiovascular Disease | Admitting: Cardiovascular Disease

## 2018-11-23 ENCOUNTER — Other Ambulatory Visit: Payer: Self-pay

## 2018-11-23 ENCOUNTER — Ambulatory Visit (HOSPITAL_COMMUNITY)
Admission: RE | Disposition: A | Discharge status: Home / Self Care | Payer: Self-pay | Source: Home / Self Care | Attending: Cardiovascular Disease

## 2018-11-23 DIAGNOSIS — K219 Gastro-esophageal reflux disease without esophagitis: Secondary | ICD-10-CM | POA: Diagnosis not present

## 2018-11-23 DIAGNOSIS — M1990 Unspecified osteoarthritis, unspecified site: Secondary | ICD-10-CM | POA: Diagnosis not present

## 2018-11-23 DIAGNOSIS — B1920 Unspecified viral hepatitis C without hepatic coma: Secondary | ICD-10-CM | POA: Diagnosis not present

## 2018-11-23 DIAGNOSIS — I060 Rheumatic aortic stenosis: Secondary | ICD-10-CM | POA: Diagnosis not present

## 2018-11-23 DIAGNOSIS — I110 Hypertensive heart disease with heart failure: Secondary | ICD-10-CM | POA: Insufficient documentation

## 2018-11-23 DIAGNOSIS — Z951 Presence of aortocoronary bypass graft: Secondary | ICD-10-CM | POA: Insufficient documentation

## 2018-11-23 DIAGNOSIS — I2510 Atherosclerotic heart disease of native coronary artery without angina pectoris: Secondary | ICD-10-CM

## 2018-11-23 DIAGNOSIS — I480 Paroxysmal atrial fibrillation: Secondary | ICD-10-CM | POA: Diagnosis not present

## 2018-11-23 DIAGNOSIS — E785 Hyperlipidemia, unspecified: Secondary | ICD-10-CM | POA: Diagnosis not present

## 2018-11-23 DIAGNOSIS — Z7982 Long term (current) use of aspirin: Secondary | ICD-10-CM | POA: Insufficient documentation

## 2018-11-23 DIAGNOSIS — Z87891 Personal history of nicotine dependence: Secondary | ICD-10-CM | POA: Insufficient documentation

## 2018-11-23 DIAGNOSIS — I350 Nonrheumatic aortic (valve) stenosis: Secondary | ICD-10-CM

## 2018-11-23 DIAGNOSIS — R7303 Prediabetes: Secondary | ICD-10-CM | POA: Diagnosis not present

## 2018-11-23 DIAGNOSIS — N401 Enlarged prostate with lower urinary tract symptoms: Secondary | ICD-10-CM | POA: Diagnosis not present

## 2018-11-23 DIAGNOSIS — N138 Other obstructive and reflux uropathy: Secondary | ICD-10-CM | POA: Insufficient documentation

## 2018-11-23 DIAGNOSIS — Z7901 Long term (current) use of anticoagulants: Secondary | ICD-10-CM | POA: Diagnosis not present

## 2018-11-23 DIAGNOSIS — N529 Male erectile dysfunction, unspecified: Secondary | ICD-10-CM | POA: Insufficient documentation

## 2018-11-23 DIAGNOSIS — I252 Old myocardial infarction: Secondary | ICD-10-CM | POA: Insufficient documentation

## 2018-11-23 DIAGNOSIS — I5022 Chronic systolic (congestive) heart failure: Secondary | ICD-10-CM | POA: Insufficient documentation

## 2018-11-23 DIAGNOSIS — R69 Illness, unspecified: Secondary | ICD-10-CM | POA: Diagnosis not present

## 2018-11-23 DIAGNOSIS — Z955 Presence of coronary angioplasty implant and graft: Secondary | ICD-10-CM | POA: Insufficient documentation

## 2018-11-23 DIAGNOSIS — Z8674 Personal history of sudden cardiac arrest: Secondary | ICD-10-CM | POA: Insufficient documentation

## 2018-11-23 DIAGNOSIS — I255 Ischemic cardiomyopathy: Secondary | ICD-10-CM | POA: Insufficient documentation

## 2018-11-23 DIAGNOSIS — Z79899 Other long term (current) drug therapy: Secondary | ICD-10-CM | POA: Insufficient documentation

## 2018-11-23 DIAGNOSIS — Z884 Allergy status to anesthetic agent status: Secondary | ICD-10-CM | POA: Insufficient documentation

## 2018-11-23 HISTORY — PX: RIGHT/LEFT HEART CATH AND CORONARY/GRAFT ANGIOGRAPHY: CATH118267

## 2018-11-23 LAB — CBC
HCT: 40.2 % (ref 39.0–52.0)
Hemoglobin: 14.2 g/dL (ref 13.0–17.0)
MCH: 37.9 pg — ABNORMAL HIGH (ref 26.0–34.0)
MCHC: 35.3 g/dL (ref 30.0–36.0)
MCV: 107.2 fL — ABNORMAL HIGH (ref 80.0–100.0)
Platelets: 158 10*3/uL (ref 150–400)
RBC: 3.75 MIL/uL — ABNORMAL LOW (ref 4.22–5.81)
RDW: 13.8 % (ref 11.5–15.5)
WBC: 6.8 10*3/uL (ref 4.0–10.5)
nRBC: 0 % (ref 0.0–0.2)

## 2018-11-23 LAB — BASIC METABOLIC PANEL
Anion gap: 11 (ref 5–15)
BUN: 19 mg/dL (ref 8–23)
CO2: 23 mmol/L (ref 22–32)
Calcium: 9.5 mg/dL (ref 8.9–10.3)
Chloride: 106 mmol/L (ref 98–111)
Creatinine, Ser: 1.43 mg/dL — ABNORMAL HIGH (ref 0.61–1.24)
GFR calc Af Amer: 53 mL/min — ABNORMAL LOW (ref >60)
GFR calc non Af Amer: 46 mL/min — ABNORMAL LOW (ref >60)
GLUCOSE: 125 mg/dL — AB (ref 70–99)
Potassium: 4.3 mmol/L (ref 3.5–5.1)
Sodium: 140 mmol/L (ref 135–145)

## 2018-11-23 LAB — POCT I-STAT EG7
Acid-base deficit: 2 mmol/L (ref 0.0–2.0)
Bicarbonate: 24.1 mmol/L (ref 20.0–28.0)
Calcium, Ion: 1.19 mmol/L (ref 1.15–1.40)
HCT: 36 % — ABNORMAL LOW (ref 39.0–52.0)
Hemoglobin: 12.2 g/dL — ABNORMAL LOW (ref 13.0–17.0)
O2 Saturation: 65 %
Potassium: 4 mmol/L (ref 3.5–5.1)
Sodium: 142 mmol/L (ref 135–145)
TCO2: 25 mmol/L (ref 22–32)
pCO2, Ven: 46.7 mmHg (ref 44.0–60.0)
pH, Ven: 7.321 (ref 7.250–7.430)
pO2, Ven: 37 mmHg (ref 32.0–45.0)

## 2018-11-23 LAB — POCT I-STAT 7, (LYTES, BLD GAS, ICA,H+H)
Acid-base deficit: 2 mmol/L (ref 0.0–2.0)
Bicarbonate: 22.9 mmol/L (ref 20.0–28.0)
Calcium, Ion: 1.26 mmol/L (ref 1.15–1.40)
HCT: 38 % — ABNORMAL LOW (ref 39.0–52.0)
Hemoglobin: 12.9 g/dL — ABNORMAL LOW (ref 13.0–17.0)
O2 Saturation: 99 %
Potassium: 4.1 mmol/L (ref 3.5–5.1)
Sodium: 141 mmol/L (ref 135–145)
TCO2: 24 mmol/L (ref 22–32)
pCO2 arterial: 40.9 mmHg (ref 32.0–48.0)
pH, Arterial: 7.357 (ref 7.350–7.450)
pO2, Arterial: 123 mmHg — ABNORMAL HIGH (ref 83.0–108.0)

## 2018-11-23 LAB — PROTIME-INR
INR: 1.1 (ref 0.8–1.2)
Prothrombin Time: 13.9 seconds (ref 11.4–15.2)

## 2018-11-23 SURGERY — RIGHT/LEFT HEART CATH AND CORONARY/GRAFT ANGIOGRAPHY
Anesthesia: LOCAL

## 2018-11-23 MED ORDER — HEPARIN (PORCINE) IN NACL 1000-0.9 UT/500ML-% IV SOLN
INTRAVENOUS | Status: AC
  Filled 2018-11-23: qty 1000

## 2018-11-23 MED ORDER — SODIUM CHLORIDE 0.9 % IV SOLN: INTRAVENOUS | Status: AC

## 2018-11-23 MED ORDER — HEPARIN (PORCINE) IN NACL 1000-0.9 UT/500ML-% IV SOLN
INTRAVENOUS | Status: DC | PRN
  Administered 2018-11-23: 500 mL

## 2018-11-23 MED ORDER — MIDAZOLAM HCL 2 MG/2ML IJ SOLN
INTRAMUSCULAR | Status: AC
  Filled 2018-11-23: qty 2

## 2018-11-23 MED ORDER — FENTANYL CITRATE (PF) 100 MCG/2ML IJ SOLN
INTRAMUSCULAR | Status: DC | PRN
  Administered 2018-11-23 (×2): 25 ug via INTRAVENOUS

## 2018-11-23 MED ORDER — MIDAZOLAM HCL 2 MG/2ML IJ SOLN
INTRAMUSCULAR | Status: DC | PRN
  Administered 2018-11-23 (×2): 1 mg via INTRAVENOUS

## 2018-11-23 MED ORDER — ASPIRIN 81 MG PO CHEW: 81.0000 mg | CHEWABLE_TABLET | ORAL | Status: DC

## 2018-11-23 MED ORDER — IOHEXOL 350 MG/ML SOLN
INTRAVENOUS | Status: DC | PRN
  Administered 2018-11-23: 80 mL via INTRACARDIAC

## 2018-11-23 MED ORDER — SODIUM CHLORIDE 0.9% FLUSH: 3.0000 mL | INTRAVENOUS | Status: DC | PRN

## 2018-11-23 MED ORDER — SODIUM CHLORIDE 0.9% FLUSH: 3.0000 mL | Freq: Two times a day (BID) | INTRAVENOUS | Status: DC

## 2018-11-23 MED ORDER — SODIUM CHLORIDE 0.9 % IV SOLN: 250.0000 mL | INTRAVENOUS | Status: DC | PRN

## 2018-11-23 MED ORDER — LIDOCAINE HCL (PF) 1 % IJ SOLN
INTRAMUSCULAR | Status: AC
  Filled 2018-11-23: qty 30

## 2018-11-23 MED ORDER — LIDOCAINE HCL (PF) 1 % IJ SOLN
INTRAMUSCULAR | Status: DC | PRN
  Administered 2018-11-23: 15 mL via INTRADERMAL
  Administered 2018-11-23: 2 mL via INTRADERMAL

## 2018-11-23 MED ORDER — ONDANSETRON HCL 4 MG/2ML IJ SOLN: 4.0000 mg | Freq: Four times a day (QID) | INTRAMUSCULAR | Status: DC | PRN

## 2018-11-23 MED ORDER — ACETAMINOPHEN 325 MG PO TABS: 650.0000 mg | ORAL_TABLET | ORAL | Status: DC | PRN

## 2018-11-23 MED ORDER — VERAPAMIL HCL 2.5 MG/ML IV SOLN
INTRAVENOUS | Status: AC
  Filled 2018-11-23: qty 2

## 2018-11-23 MED ORDER — FENTANYL CITRATE (PF) 100 MCG/2ML IJ SOLN
INTRAMUSCULAR | Status: AC
  Filled 2018-11-23: qty 2

## 2018-11-23 MED ORDER — SODIUM CHLORIDE 0.9 % IV SOLN
INTRAVENOUS | Status: DC
  Administered 2018-11-23: 07:00:00 via INTRAVENOUS

## 2018-11-23 SURGICAL SUPPLY — 21 items
CATH BALLN WEDGE 5F 110CM (CATHETERS) ×2 IMPLANT
CATH INFINITI 5 FR AL2 (CATHETERS) ×2 IMPLANT
CATH INFINITI 5 FR IM (CATHETERS) ×2 IMPLANT
CATH INFINITI 5 FR RCB (CATHETERS) ×2 IMPLANT
CATH INFINITI 5FR MULTPACK ANG (CATHETERS) ×2 IMPLANT
CATH SWAN GANZ 7F STRAIGHT (CATHETERS) ×2 IMPLANT
GLIDESHEATH SLEND SS 6F .021 (SHEATH) ×2 IMPLANT
GUIDEWIRE .025 260CM (WIRE) ×2 IMPLANT
GUIDEWIRE INQWIRE 1.5J.035X260 (WIRE) ×1 IMPLANT
INQWIRE 1.5J .035X260CM (WIRE) ×2
KIT HEART LEFT (KITS) ×2 IMPLANT
PACK CARDIAC CATHETERIZATION (CUSTOM PROCEDURE TRAY) ×2 IMPLANT
SHEATH GLIDE SLENDER 4/5FR (SHEATH) ×2 IMPLANT
SHEATH PINNACLE 5F 10CM (SHEATH) ×2 IMPLANT
SHEATH PINNACLE 7F 10CM (SHEATH) ×2 IMPLANT
SHEATH PROBE COVER 6X72 (BAG) ×2 IMPLANT
TRANSDUCER W/STOPCOCK (MISCELLANEOUS) ×2 IMPLANT
TUBING CIL FLEX 10 FLL-RA (TUBING) ×2 IMPLANT
WIRE EMERALD 3MM-J .025X260CM (WIRE) ×2 IMPLANT
WIRE EMERALD 3MM-J .035X150CM (WIRE) ×2 IMPLANT
WIRE EMERALD ST .035X150CM (WIRE) ×2 IMPLANT

## 2018-11-23 NOTE — CV Procedure (Signed)
   5 Fr R F/A and 7 Fr R F/V shea'ts were pulled and manual pressure was held for 25 min. Sterile gauze was applied at the the site. R groin is soft and non tender.   Bed rest started at 1025 X 4 hr.. Instructions were given about the bed rest,  HR 70 paced BP 142/79 sPO2 94 % on R/A

## 2018-11-23 NOTE — Discharge Instructions (Signed)
Femoral Site Care °This sheet gives you information about how to care for yourself after your procedure. Your health care provider may also give you more specific instructions. If you have problems or questions, contact your health care provider. °What can I expect after the procedure? °After the procedure, it is common to have: °· Bruising that usually fades within 1-2 weeks. °· Tenderness at the site. °Follow these instructions at home: °Wound care °· Follow instructions from your health care provider about how to take care of your insertion site. Make sure you: °? Wash your hands with soap and water before you change your bandage (dressing). If soap and water are not available, use hand sanitizer. °? Change your dressing as told by your health care provider. °? Leave stitches (sutures), skin glue, or adhesive strips in place. These skin closures may need to stay in place for 2 weeks or longer. If adhesive strip edges start to loosen and curl up, you may trim the loose edges. Do not remove adhesive strips completely unless your health care provider tells you to do that. °· Do not take baths, swim, or use a hot tub until your health care provider approves. °· You may shower 24-48 hours after the procedure or as told by your health care provider. °? Gently wash the site with plain soap and water. °? Pat the area dry with a clean towel. °? Do not rub the site. This may cause bleeding. °· Do not apply powder or lotion to the site. Keep the site clean and dry. °· Check your femoral site every day for signs of infection. Check for: °? Redness, swelling, or pain. °? Fluid or blood. °? Warmth. °? Pus or a bad smell. °Activity °· For the first 2-3 days after your procedure, or as long as directed: °? Avoid climbing stairs as much as possible. °? Do not squat. °· Do not lift anything that is heavier than 10 lb (4.5 kg), or the limit that you are told, until your health care provider says that it is safe. °· Rest as  directed. °? Avoid sitting for a long time without moving. Get up to take short walks every 1-2 hours. °· Do not drive for 24 hours if you were given a medicine to help you relax (sedative). °General instructions °· Take over-the-counter and prescription medicines only as told by your health care provider. °· Keep all follow-up visits as told by your health care provider. This is important. °Contact a health care provider if you have: °· A fever or chills. °· You have redness, swelling, or pain around your insertion site. °Get help right away if: °· The catheter insertion area swells very fast. °· You pass out. °· You suddenly start to sweat or your skin gets clammy. °· The catheter insertion area is bleeding, and the bleeding does not stop when you hold steady pressure on the area. °· The area near or just beyond the catheter insertion site becomes pale, cool, tingly, or numb. °These symptoms may represent a serious problem that is an emergency. Do not wait to see if the symptoms will go away. Get medical help right away. Call your local emergency services (911 in the [place] Do not drive yourself to the hospital. °Summary °· After the procedure, it is common to have bruising that usually fades within 1-2 weeks. °· Check your femoral site every day for signs of infection. °· Do not lift anything that is heavier than 10 lb (4.5 kg), or the   limit that you are told, until your health care provider says that it is safe. °This information is not intended to replace advice given to you by your health care provider. Make sure you discuss any questions you have with your health care provider. °Document Released: 04/25/2014 Document Revised: 09/04/2017 Document Reviewed: 09/04/2017 °Elsevier Interactive Patient Education © 2019 [employer] ° °

## 2018-11-23 NOTE — Interval H&P Note (Signed)
History and Physical Interval Note:  11/23/2018 7:11 AM  Derrick Hill  has presented today for cardiac cath with the diagnosis of severe aortic stenosis. The various methods of treatment have been discussed with the patient and family. After consideration of risks, benefits and other options for treatment, the patient has consented to  Procedure(s): RIGHT/LEFT HEART CATH AND CORONARY/GRAFT ANGIOGRAPHY (N/A) as a surgical intervention.  The patient's history has been reviewed, patient examined, no change in status, stable for surgery.  I have reviewed the patient's chart and labs.  Questions were answered to the patient's satisfaction.    Cath Lab Visit (complete for each Cath Lab visit)  Clinical Evaluation Leading to the Procedure:   ACS: No.  Non-ACS:    Anginal Classification: CCS II  Anti-ischemic medical therapy: Maximal Therapy (2 or more classes of medications)  Non-Invasive Test Results: No non-invasive testing performed  Prior CABG: No previous CABG        Verne Carrow

## 2018-11-26 ENCOUNTER — Encounter (HOSPITAL_COMMUNITY): Payer: Self-pay | Admitting: Cardiovascular Disease

## 2018-11-26 MED FILL — Verapamil HCl IV Soln 2.5 MG/ML: INTRAVENOUS | Qty: 2 | Status: AC

## 2018-12-07 DIAGNOSIS — Z7901 Long term (current) use of anticoagulants: Secondary | ICD-10-CM | POA: Diagnosis not present

## 2018-12-07 DIAGNOSIS — I4891 Unspecified atrial fibrillation: Secondary | ICD-10-CM | POA: Diagnosis not present

## 2018-12-07 DIAGNOSIS — Z5181 Encounter for therapeutic drug level monitoring: Secondary | ICD-10-CM | POA: Diagnosis not present

## 2018-12-13 DIAGNOSIS — S51812A Laceration without foreign body of left forearm, initial encounter: Secondary | ICD-10-CM | POA: Diagnosis not present

## 2018-12-20 DIAGNOSIS — Z5181 Encounter for therapeutic drug level monitoring: Secondary | ICD-10-CM | POA: Diagnosis not present

## 2018-12-20 DIAGNOSIS — Z7901 Long term (current) use of anticoagulants: Secondary | ICD-10-CM | POA: Diagnosis not present

## 2018-12-20 DIAGNOSIS — I4891 Unspecified atrial fibrillation: Secondary | ICD-10-CM | POA: Diagnosis not present

## 2018-12-31 DIAGNOSIS — Z45010 Encounter for checking and testing of cardiac pacemaker pulse generator [battery]: Secondary | ICD-10-CM | POA: Diagnosis not present

## 2018-12-31 DIAGNOSIS — I501 Left ventricular failure: Secondary | ICD-10-CM | POA: Diagnosis not present

## 2018-12-31 DIAGNOSIS — I255 Ischemic cardiomyopathy: Secondary | ICD-10-CM | POA: Diagnosis not present

## 2019-01-04 ENCOUNTER — Other Ambulatory Visit: Payer: Self-pay

## 2019-01-04 DIAGNOSIS — I350 Nonrheumatic aortic (valve) stenosis: Secondary | ICD-10-CM

## 2019-01-04 DIAGNOSIS — R0609 Other forms of dyspnea: Secondary | ICD-10-CM

## 2019-01-07 DIAGNOSIS — I350 Nonrheumatic aortic (valve) stenosis: Secondary | ICD-10-CM | POA: Diagnosis not present

## 2019-01-07 DIAGNOSIS — R609 Edema, unspecified: Secondary | ICD-10-CM | POA: Diagnosis not present

## 2019-01-08 DIAGNOSIS — R609 Edema, unspecified: Secondary | ICD-10-CM | POA: Diagnosis not present

## 2019-01-08 DIAGNOSIS — I501 Left ventricular failure: Secondary | ICD-10-CM | POA: Diagnosis not present

## 2019-01-08 DIAGNOSIS — I350 Nonrheumatic aortic (valve) stenosis: Secondary | ICD-10-CM | POA: Diagnosis not present

## 2019-01-08 DIAGNOSIS — I4891 Unspecified atrial fibrillation: Secondary | ICD-10-CM | POA: Diagnosis not present

## 2019-01-09 ENCOUNTER — Encounter: Payer: Self-pay | Admitting: Surgery

## 2019-01-10 ENCOUNTER — Ambulatory Visit: Payer: No Typology Code available for payment source | Attending: Cardiovascular Disease | Admitting: Physical Therapy

## 2019-01-10 ENCOUNTER — Ambulatory Visit (HOSPITAL_COMMUNITY)
Admission: RE | Admit: 2019-01-10 | Discharge: 2019-01-10 | Disposition: A | Discharge status: Home / Self Care | Payer: No Typology Code available for payment source | Source: Ambulatory Visit | Attending: Cardiovascular Disease | Admitting: Cardiovascular Disease

## 2019-01-10 ENCOUNTER — Other Ambulatory Visit: Payer: Self-pay

## 2019-01-10 ENCOUNTER — Encounter: Payer: Self-pay | Admitting: Physical Therapy

## 2019-01-10 ENCOUNTER — Ambulatory Visit (HOSPITAL_BASED_OUTPATIENT_CLINIC_OR_DEPARTMENT_OTHER)
Admission: RE | Admit: 2019-01-10 | Discharge: 2019-01-10 | Disposition: A | Discharge status: Home / Self Care | Payer: No Typology Code available for payment source | Source: Ambulatory Visit | Attending: Cardiovascular Disease | Admitting: Cardiovascular Disease

## 2019-01-10 ENCOUNTER — Encounter (HOSPITAL_COMMUNITY): Payer: Non-veteran care

## 2019-01-10 DIAGNOSIS — R0609 Other forms of dyspnea: Secondary | ICD-10-CM

## 2019-01-10 DIAGNOSIS — I509 Heart failure, unspecified: Secondary | ICD-10-CM | POA: Insufficient documentation

## 2019-01-10 DIAGNOSIS — R262 Difficulty in walking, not elsewhere classified: Secondary | ICD-10-CM | POA: Insufficient documentation

## 2019-01-10 DIAGNOSIS — I350 Nonrheumatic aortic (valve) stenosis: Secondary | ICD-10-CM

## 2019-01-10 DIAGNOSIS — I252 Old myocardial infarction: Secondary | ICD-10-CM | POA: Insufficient documentation

## 2019-01-10 DIAGNOSIS — Q211 Atrial septal defect: Secondary | ICD-10-CM | POA: Insufficient documentation

## 2019-01-10 DIAGNOSIS — I352 Nonrheumatic aortic (valve) stenosis with insufficiency: Secondary | ICD-10-CM | POA: Insufficient documentation

## 2019-01-10 DIAGNOSIS — I2510 Atherosclerotic heart disease of native coronary artery without angina pectoris: Secondary | ICD-10-CM | POA: Insufficient documentation

## 2019-01-10 MED ORDER — IOHEXOL 350 MG/ML SOLN
100.0000 mL | Freq: Once | INTRAVENOUS | Status: AC | PRN
  Administered 2019-01-10: 100 mL via INTRAVENOUS

## 2019-01-10 NOTE — Progress Notes (Signed)
Carotid duplex       has been completed. Preliminary results can be found under CV proc through chart review. Randy Castrejon, BS, RDMS, RVT   

## 2019-01-10 NOTE — Therapy (Addendum)
[facility] [address] [place], [place], [address] Phone: [phone]   Fax:  [phone]  Physical Therapy Evaluation/Pre-TAVR  Patient Details  Name: Derrick Hill MRN: [id number] Date of Birth: 1935/01/04 Referring Provider (PT): Kathleene Hazel, MD   Encounter Date: 01/10/2019  PT End of Session - 01/10/19 1357    Visit Number  1    PT Start Time  1305    PT Stop Time  1350    PT Time Calculation (min)  45 min    Activity Tolerance  Patient limited by fatigue;Treatment limited secondary to medical complications (Comment)    Behavior During Therapy  [facility] for tasks assessed/performed       Past Medical History:  Diagnosis Date  . Aortic stenosis   . Arrhythmia    paroxysmal atrial fibrillation  . Arthritis   . Bacteremia   . Cardiac arrest (HCC)   . Cardiac LV ejection fraction 30-35%   . Cataract   . Compression of lumbar vertebra (HCC)   . Coronary artery disease   . Dyspnea on exertion   . ED (erectile dysfunction)   . Elevated prostate specific antigen (PSA)   . GERD (gastroesophageal reflux disease)   . Glaucoma primary, open angle    BOTH EYES  . H/O ventricular tachycardia   . Hepatitis C virus infection   . History of discitis   . Hyperlipidemia   . Hypertension   . Hypertrophy of prostate with urinary obstruction    AND LOWER URINARY TRACT SYMPTOMS  . Hypokinesis   . Liver disease   . Macular hole of left eye   . Non-ST elevation MI (NSTEMI) (HCC)   . Pre-diabetes   . Pseudophakia of left eye   . Retinal detachment   . Syncope   . Systolic CHF, chronic (HCC)   . Thrombus    laa    Past Surgical History:  Procedure Laterality Date  . AV NODE ABLATION    . CARDIAC CATHETERIZATION    . CATARACT EXTRACTION    . CHOLECYSTECTOMY    . CORONARY ANGIOPLASTY WITH STENT PLACEMENT    . CORONARY ARTERY BYPASS GRAFT    . INSERT / REPLACE / REMOVE PACEMAKER    . RIGHT/LEFT HEART CATH AND  CORONARY/GRAFT ANGIOGRAPHY N/A 11/23/2018   Procedure: RIGHT/LEFT HEART CATH AND CORONARY/GRAFT ANGIOGRAPHY;  Surgeon: Kathleene Hazel, MD;  Location: [facility] INVASIVE CV LAB;  Service: Cardiovascular;  Laterality: N/A;  . SKIN BIOPSY    . YAG LASER APPLICATION      There were no vitals filed for this visit.   Subjective Assessment - 01/10/19 1313    Subjective  For years now, I can't breathe. About 25 yr ago had a bypass. Walking up a flight of stairs I am short of breath in the last few years. I have back pain, I do a lot of core exercises so it really isn't that bad. Uses RW or cart occasionally to lean on.     Currently in Pain?  No/denies         [facility] PT Assessment - 01/10/19 0001      Assessment   Medical Diagnosis  severe aortic stenosis    Referring Provider (PT)  Kathleene Hazel, MD    Hand Dominance  Right      Precautions   Precautions  None      Restrictions   Weight Bearing Restrictions  No      Balance Screen  Has the patient fallen in the past 6 months  No      Home Nurse, mental health  Private residence    Living Arrangements  Spouse/significant other    Additional Comments  has stairs at home but does not have to use them      Prior Function   Level of Independence  Independent    Vocation  Retired      IT consultant   Overall Cognitive Status  Within Functional Limits for tasks assessed      Sensation   Additional Comments  WFL, takes a water pill for LE edema      Posture/Postural Control   Posture Comments  rounded shoulders with forward head      ROM / Strength   AROM / PROM / Strength  AROM;Strength      AROM   Overall AROM Comments  WFL      Strength   Overall Strength Comments  gross 5/5    Strength Assessment Site  Hand    Right/Left hand  Right;Left    Right Hand Grip (lbs)  55    Left Hand Grip (lbs)  40       [facility] Pre-Surgical Assessment - 01/10/19 0001    5 Meter Walk Test- trial 1  4 sec    5 Meter  Walk Test- trial 2  4 sec.    4 Stage Balance Test Position  3    Sit To Stand Test- trial 1  15 sec.    6 Minute Walk- Baseline  yes    BP (mmHg)  151/88    HR (bpm)  76    02 Sat (%RA)  93 %    Modified Borg Scale for Dyspnea  0- Nothing at all    Perceived Rate of Exertion (Borg)  6-    6 Minute Walk Post Test  yes    BP (mmHg)  145/84    HR (bpm)  89    02 Sat (%RA)  98 %    Modified Borg Scale for Dyspnea  4- somewhat severe    Perceived Rate of Exertion (Borg)  13- Somewhat hard    Aerobic Endurance Distance Walked  211    Endurance additional comments  58% disability compared to age related norm        Addendum 01/16/2019 5 meter walk test- trial 1 4 sec 5 meter walk test- trial 2 4 sec 5 meter walk test- trial 3 4 sec 5 meter walk average 4 sec      Objective measurements completed on examination: See above findings.                           Plan - 01/10/19 1307    PT Frequency  --   one time pre-TAVR evaluation   Consulted and Agree with Plan of Care  Patient       Clinical Impression Statement: Pt is a 84 yo M presenting to OP PT for evaluation prior to possible TAVR surgery due to severe aortic stenosis. Pt reports onset of SOB many years ago. Symptoms are  limiting ambulation endurance. Pt presents with WFL ROM and strength and occasional musculoskeletal pain in low back.  Pt ambulated 100 feet in 1 min before requesting a seated rest beak lasting 3:30. At time of rest, patient's HR was 88 bpm and O2 was 96% on room air. Pt reported 5/10 shortness of breath on modified  scale for dyspnea. Pt able to resume after rest and ambulate an additional 111 feet.Pt ambulated a total of 211 feet in 6 minute walk and reported 4/10 SOB on modified scale for dyspena and 13/20 RPE on Borg's perceived exertion and pain scale at the end of the walk. During the 6 minute walk test, patient's HR increased to 88 BPM and O2 saturation decreased to 96%. Based on  the Short Physical Performance Battery, patient has a frailty rating of 9/12 with </= 5/12 considered frail.  Visit Diagnosis: Difficulty in walking, not elsewhere classified     Problem List Patient Active Problem List   Diagnosis Date Noted  . Coronary artery disease involving native coronary artery of native heart without angina pectoris   . Severe aortic stenosis     Quincie Haroon C. Shaundrea Carrigg PT, DPT 01/10/19 2:01 PM   [facility] [address] [place], [place], [address] Phone: [phone]   Fax:  [phone]  Name: Derrick Hill MRN: [id number] Date of Birth: [birth year]

## 2019-01-10 NOTE — Progress Notes (Signed)
  Echocardiogram 2D Echocardiogram has been performed.  Derrick Hill 01/10/2019, 9:39 AM

## 2019-01-16 ENCOUNTER — Other Ambulatory Visit: Payer: Self-pay

## 2019-01-16 DIAGNOSIS — I350 Nonrheumatic aortic (valve) stenosis: Secondary | ICD-10-CM

## 2019-01-17 NOTE — Pre-Procedure Instructions (Signed)
NEILAN DOWNHAM  01/17/2019     [facility] [address] [place], [place] - [address] [facility] [address] [place] [address] Phone: [phone] Fax: [phone]    Your procedure is scheduled on Tuesday, May 19th  Report to [facility] Entrance A at 10:15 A.M  Call this number if you have problems the morning of surgery:  [phone]   Remember:  Do not eat or drink after midnight.     Take these medicines the morning of surgery with A SIP OF WATER  NONE  Follow your surgeon's instructions on when to stop Aspirin.  If no instructions were given by your surgeon then you will need to call the office to get those instructions.    7 days prior to surgery STOP taking any Aspirin (unless otherwise instructed by your surgeon), Aleve, Naproxen, Ibuprofen, Motrin, Advil, Goody's, BC's, all herbal medications, fish oil, and all vitamins.    Do not wear jewelry, make-up or nail polish.  Do not wear lotions, powders, or perfumes, or deodorant.  Do not shave 48 hours prior to surgery.  Men may shave face and neck.  Do not bring valuables to the hospital.  [facility] is not responsible for any belongings or valuables.  Contacts, dentures or bridgework may not be worn into surgery.  Leave your suitcase in the car.  After surgery it may be brought to your room.  For patients admitted to the hospital, discharge time will be determined by your treatment team.  Patients discharged the day of surgery will not be allowed to drive home.   Special instructions: [place]- Preparing For Surgery  Before surgery, you can play an important role. Because skin is not sterile, your skin needs to be as free of germs as possible. You can reduce the number of germs on your skin by washing with CHG (chlorahexidine gluconate) Soap before surgery.  CHG is an antiseptic cleaner which kills germs and bonds with the skin to continue killing germs even after washing.    Oral Hygiene  is also important to reduce your risk of infection.  Remember - BRUSH YOUR TEETH THE MORNING OF SURGERY WITH YOUR REGULAR TOOTHPASTE  Please do not use if you have an allergy to CHG or antibacterial soaps. If your skin becomes reddened/irritated stop using the CHG.  Do not shave (including legs and underarms) for at least 48 hours prior to first CHG shower. It is OK to shave your face.  Please follow these instructions carefully.   1. Shower the NIGHT BEFORE SURGERY and the MORNING OF SURGERY with CHG.   2. If you chose to wash your hair, wash your hair first as usual with your normal shampoo.  3. After you shampoo, rinse your hair and body thoroughly to remove the shampoo.  4. Use CHG as you would any other liquid soap. You can apply CHG directly to the skin and wash gently with a scrungie or a clean washcloth.   5. Apply the CHG Soap to your body ONLY FROM THE NECK DOWN.  Do not use on open wounds or open sores. Avoid contact with your eyes, ears, mouth and genitals (private parts). Wash Face and genitals (private parts)  with your normal soap.  6. Wash thoroughly, paying special attention to the area where your surgery will be performed.  7. Thoroughly rinse your body with warm water from the neck down.  8. DO NOT shower/wash with your normal soap after  using and rinsing off the CHG Soap.  9. Pat yourself dry with a CLEAN TOWEL.  10. Wear CLEAN PAJAMAS to bed the night before surgery, wear comfortable clothes the morning of surgery  11. Place CLEAN SHEETS on your bed the night of your first shower and DO NOT SLEEP WITH PETS.  Day of Surgery:  Do not apply any deodorants/lotions.  Please wear clean clothes to the hospital/surgery center.   Remember to brush your teeth WITH YOUR REGULAR TOOTHPASTE.  Please read over the following fact sheets that you were given. Pain Booklet, Coughing and Deep Breathing, MRSA Information and Surgical Site Infection Prevention

## 2019-01-18 ENCOUNTER — Other Ambulatory Visit (HOSPITAL_COMMUNITY)
Admission: RE | Admit: 2019-01-18 | Discharge: 2019-01-18 | Disposition: A | Discharge status: Home / Self Care | Payer: No Typology Code available for payment source | Source: Ambulatory Visit | Attending: Cardiovascular Disease | Admitting: Cardiovascular Disease

## 2019-01-18 ENCOUNTER — Other Ambulatory Visit: Payer: Self-pay

## 2019-01-18 ENCOUNTER — Encounter (HOSPITAL_COMMUNITY): Payer: Self-pay

## 2019-01-18 ENCOUNTER — Institutional Professional Consult (permissible substitution) (INDEPENDENT_AMBULATORY_CARE_PROVIDER_SITE_OTHER): Payer: Medicare HMO | Admitting: Surgery

## 2019-01-18 ENCOUNTER — Encounter (HOSPITAL_COMMUNITY)
Admission: RE | Admit: 2019-01-18 | Discharge: 2019-01-18 | Disposition: A | Discharge status: Home / Self Care | Payer: No Typology Code available for payment source | Source: Ambulatory Visit | Attending: Cardiovascular Disease | Admitting: Cardiovascular Disease

## 2019-01-18 ENCOUNTER — Ambulatory Visit (HOSPITAL_COMMUNITY)
Admission: RE | Admit: 2019-01-18 | Discharge: 2019-01-18 | Disposition: A | Discharge status: Home / Self Care | Payer: No Typology Code available for payment source | Source: Ambulatory Visit | Attending: Cardiovascular Disease | Admitting: Cardiovascular Disease

## 2019-01-18 ENCOUNTER — Encounter: Payer: Self-pay | Admitting: Surgery

## 2019-01-18 VITALS — BP 105/69 | HR 86 | Temp 97.5°F | Resp 20 | Ht 66.0 in | Wt 179.0 lb

## 2019-01-18 DIAGNOSIS — I350 Nonrheumatic aortic (valve) stenosis: Secondary | ICD-10-CM

## 2019-01-18 DIAGNOSIS — Z1159 Encounter for screening for other viral diseases: Secondary | ICD-10-CM | POA: Diagnosis not present

## 2019-01-18 DIAGNOSIS — Z79899 Other long term (current) drug therapy: Secondary | ICD-10-CM | POA: Insufficient documentation

## 2019-01-18 DIAGNOSIS — I700 Atherosclerosis of aorta: Secondary | ICD-10-CM | POA: Insufficient documentation

## 2019-01-18 DIAGNOSIS — Z9049 Acquired absence of other specified parts of digestive tract: Secondary | ICD-10-CM | POA: Insufficient documentation

## 2019-01-18 DIAGNOSIS — Z950 Presence of cardiac pacemaker: Secondary | ICD-10-CM | POA: Insufficient documentation

## 2019-01-18 DIAGNOSIS — I5022 Chronic systolic (congestive) heart failure: Secondary | ICD-10-CM | POA: Insufficient documentation

## 2019-01-18 DIAGNOSIS — M1990 Unspecified osteoarthritis, unspecified site: Secondary | ICD-10-CM | POA: Diagnosis not present

## 2019-01-18 DIAGNOSIS — H409 Unspecified glaucoma: Secondary | ICD-10-CM | POA: Diagnosis not present

## 2019-01-18 DIAGNOSIS — N400 Enlarged prostate without lower urinary tract symptoms: Secondary | ICD-10-CM | POA: Insufficient documentation

## 2019-01-18 DIAGNOSIS — Z9889 Other specified postprocedural states: Secondary | ICD-10-CM | POA: Diagnosis not present

## 2019-01-18 DIAGNOSIS — Z951 Presence of aortocoronary bypass graft: Secondary | ICD-10-CM | POA: Insufficient documentation

## 2019-01-18 DIAGNOSIS — Z7901 Long term (current) use of anticoagulants: Secondary | ICD-10-CM | POA: Insufficient documentation

## 2019-01-18 DIAGNOSIS — I255 Ischemic cardiomyopathy: Secondary | ICD-10-CM | POA: Insufficient documentation

## 2019-01-18 DIAGNOSIS — Z01818 Encounter for other preprocedural examination: Secondary | ICD-10-CM | POA: Insufficient documentation

## 2019-01-18 DIAGNOSIS — Z955 Presence of coronary angioplasty implant and graft: Secondary | ICD-10-CM | POA: Diagnosis not present

## 2019-01-18 DIAGNOSIS — Q211 Atrial septal defect: Secondary | ICD-10-CM | POA: Insufficient documentation

## 2019-01-18 DIAGNOSIS — I2510 Atherosclerotic heart disease of native coronary artery without angina pectoris: Secondary | ICD-10-CM | POA: Insufficient documentation

## 2019-01-18 DIAGNOSIS — I110 Hypertensive heart disease with heart failure: Secondary | ICD-10-CM | POA: Insufficient documentation

## 2019-01-18 DIAGNOSIS — I480 Paroxysmal atrial fibrillation: Secondary | ICD-10-CM | POA: Insufficient documentation

## 2019-01-18 DIAGNOSIS — Z7982 Long term (current) use of aspirin: Secondary | ICD-10-CM | POA: Insufficient documentation

## 2019-01-18 DIAGNOSIS — K219 Gastro-esophageal reflux disease without esophagitis: Secondary | ICD-10-CM | POA: Insufficient documentation

## 2019-01-18 DIAGNOSIS — Z95810 Presence of automatic (implantable) cardiac defibrillator: Secondary | ICD-10-CM | POA: Insufficient documentation

## 2019-01-18 DIAGNOSIS — E785 Hyperlipidemia, unspecified: Secondary | ICD-10-CM | POA: Insufficient documentation

## 2019-01-18 DIAGNOSIS — R7303 Prediabetes: Secondary | ICD-10-CM | POA: Insufficient documentation

## 2019-01-18 DIAGNOSIS — I352 Nonrheumatic aortic (valve) stenosis with insufficiency: Secondary | ICD-10-CM | POA: Insufficient documentation

## 2019-01-18 DIAGNOSIS — Z8674 Personal history of sudden cardiac arrest: Secondary | ICD-10-CM | POA: Insufficient documentation

## 2019-01-18 DIAGNOSIS — I252 Old myocardial infarction: Secondary | ICD-10-CM | POA: Insufficient documentation

## 2019-01-18 HISTORY — DX: Presence of automatic (implantable) cardiac defibrillator: Z95.810

## 2019-01-18 LAB — COMPREHENSIVE METABOLIC PANEL
ALT: 23 U/L (ref 0–44)
AST: 62 U/L — ABNORMAL HIGH (ref 15–41)
Albumin: 3.6 g/dL (ref 3.5–5.0)
Alkaline Phosphatase: 69 U/L (ref 38–126)
Anion gap: 13 (ref 5–15)
BUN: 21 mg/dL (ref 8–23)
CO2: 19 mmol/L — ABNORMAL LOW (ref 22–32)
Calcium: 9.3 mg/dL (ref 8.9–10.3)
Chloride: 106 mmol/L (ref 98–111)
Creatinine, Ser: 1.37 mg/dL — ABNORMAL HIGH (ref 0.61–1.24)
GFR calc Af Amer: 56 mL/min — ABNORMAL LOW (ref >60)
GFR calc non Af Amer: 48 mL/min — ABNORMAL LOW (ref >60)
Glucose, Bld: 90 mg/dL (ref 70–99)
Potassium: 4.6 mmol/L (ref 3.5–5.1)
Sodium: 138 mmol/L (ref 135–145)
Total Bilirubin: 2.6 mg/dL — ABNORMAL HIGH (ref 0.3–1.2)
Total Protein: 6.2 g/dL — ABNORMAL LOW (ref 6.5–8.1)

## 2019-01-18 LAB — URINALYSIS, ROUTINE W REFLEX MICROSCOPIC
Bilirubin Urine: NEGATIVE
Glucose, UA: NEGATIVE mg/dL
Hgb urine dipstick: NEGATIVE
Ketones, ur: NEGATIVE mg/dL
Leukocytes,Ua: NEGATIVE
Nitrite: NEGATIVE
Protein, ur: NEGATIVE mg/dL
Specific Gravity, Urine: 1.018 (ref 1.005–1.030)
pH: 6 (ref 5.0–8.0)

## 2019-01-18 LAB — CBC
HCT: 44.9 % (ref 39.0–52.0)
Hemoglobin: 14.6 g/dL (ref 13.0–17.0)
MCH: 35.6 pg — ABNORMAL HIGH (ref 26.0–34.0)
MCHC: 32.5 g/dL (ref 30.0–36.0)
MCV: 109.5 fL — ABNORMAL HIGH (ref 80.0–100.0)
Platelets: 139 10*3/uL — ABNORMAL LOW (ref 150–400)
RBC: 4.1 MIL/uL — ABNORMAL LOW (ref 4.22–5.81)
RDW: 14.2 % (ref 11.5–15.5)
WBC: 7 10*3/uL (ref 4.0–10.5)
nRBC: 0 % (ref 0.0–0.2)

## 2019-01-18 LAB — PROTIME-INR
INR: 1.6 — ABNORMAL HIGH (ref 0.8–1.2)
Prothrombin Time: 18.8 seconds — ABNORMAL HIGH (ref 11.4–15.2)

## 2019-01-18 LAB — ABO/RH: ABO/RH(D): A POS

## 2019-01-18 LAB — HEMOGLOBIN A1C
Hgb A1c MFr Bld: 5.5 % (ref 4.8–5.6)
Mean Plasma Glucose: 111.15 mg/dL

## 2019-01-18 LAB — TYPE AND SCREEN
ABO/RH(D): A POS
Antibody Screen: NEGATIVE

## 2019-01-18 LAB — SURGICAL PCR SCREEN
MRSA, PCR: NEGATIVE
Staphylococcus aureus: NEGATIVE

## 2019-01-18 LAB — BRAIN NATRIURETIC PEPTIDE: B Natriuretic Peptide: 682.2 pg/mL — ABNORMAL HIGH (ref 0.0–100.0)

## 2019-01-18 LAB — APTT: aPTT: 32 seconds (ref 24–36)

## 2019-01-18 NOTE — Progress Notes (Signed)
Left VM with Lauren about abnormal labs. Paged Jefferey Pica, but did not hear back from her.  Notified Revonda Standard of abnormal labs.

## 2019-01-18 NOTE — Progress Notes (Signed)
PCP - Dr. Coralee North Day  Cardiologist - [facility]  Device Manager-[facility] (Pt unaware of provider name)  Chest x-ray - 01-18-19  EKG - 01-18-19  Stress Test - denies  ECHO - 01-10-19  Cardiac Cath - 11-23-18  AICD- Biventricular paced ICD ([employer]). Physician orders faxed. Lajoyce Corners with [employer] emailed with pt info. PM-denies LOOP-denies  Sleep Study - denies CPAP - NA  LABS-CBC.CMP,PT,APTT,A1C,BNP,ABG,T/S,UA,PCR  ASA- Do not take DOS Warfarin- Last day 01/16/19 ERAS-NA  HA1C-01/18/19 Fasting Blood Sugar -  Checks Blood Sugar __0___ times a day  Anesthesia-Y. Extensive cardiac history.  Pt denies having chest pain, sob, or fever at this time. All instructions explained to the pt, with a verbal understanding of the material. Pt agrees to go over the instructions while at home for a better understanding. The opportunity to ask questions was provided.

## 2019-01-18 NOTE — Progress Notes (Signed)
  Coronavirus Screening  Have you experienced the following symptoms:  Cough yes/no: No Fever (>100.35F)  yes/no: No Runny nose yes/no: No Sore throat yes/no: No Difficulty breathing/shortness of breath  yes/no: No  Have you or a family member traveled in the last 14 days and where? yes/no: No  Pt will get tested today for COVID at the [facility] campus. If the patient indicates "YES" to the above questions, their PAT will be rescheduled to limit the exposure to others and, the surgeon will be notified. THE PATIENT WILL NEED TO BE ASYMPTOMATIC FOR 14 DAYS.   If the patient is not experiencing any of these symptoms, the PAT nurse will instruct them to NOT bring anyone with them to their appointment since they may have these symptoms or traveled as well.   Please remind your patients and families that hospital visitation restrictions are in effect and the importance of the restrictions.

## 2019-01-18 NOTE — Progress Notes (Signed)
Patient ID: Derrick Hill, male   DOB: Oct 12, 1934, 84 y.o.   MRN: [id number]  [facility]  MULTIDISCIPLINARY HEART VALVE CLINIC  CARDIOTHORACIC SURGERY CONSULTATION REPORT  Referring Provider is Day, Pernell Dupre, MD Primary Cardiologist is No primary care provider on file. PCP is Day, Pernell Dupre, MD  Chief Complaint  Patient presents with   Aortic Stenosis    Surgical eval for TAVR, review all studies    HPI:  The patient is an 84 year old gentleman with a history of HTN, hyperlipidemia, PAF on Coumadin ,ischemic cardiomyopathy and chronic systolic heart failure, s/p CABG x 4 (LIMA to LAD, SVG to ramus, SVG to Diagonal, SVG to PDA) at [facility], PCI of the OM1 in 2014 with DES, NSVT s/p ICD which had to be removed and replaced in 2016 due to enterococcal bacteremia and known severe aortic stenosis. He was seen by Dr. Adella Hare at [facility] and underwent TAVR workup in 2015 but did not proceed with TAVR because he was told that it may not improve his symptoms. He has been followed at the [facility] in [place] since then. An echo in 04/2018 showed an EF of 35-45% with thickened and calcified aortic valve leaflets and a mean gradient of 27 mm Hg. The AVA was 0.44 cm2 with a DI of 0.15. He was referred to Dr. Clifton James for another opinion concerning the benefit of TAVR. Cardiac cath on 11/23/2018 showed severe 3-vessel CAD with 3/4 patent grafts. The mean gradient across the aortic valve was 8.6 mm Hg with a peak to peak gradient of 14. CI was 2.0. PA pressure at that time was 75/33 with a mean of 48 and an LVEDP of 25. His most recent echo on 01/10/2019 showed a mean gradient of 19.8 mm Hg wit a peak of 37.1 mm Hg with an AVA of 0.53 cm2. His LVEF has decreased to 25%.  The patient is her today with his wife. He reports shortness of breath with moderate exertion and some fatigue but no chest pain. His wife says that he gets short of breath with any activity and she feels that he has progressively  worsened over the past few months. He has some dizziness but no syncope. He does get some swelling in his legs.  Past Medical History:  Diagnosis Date   Aortic stenosis    Arrhythmia    paroxysmal atrial fibrillation   Arthritis    Bacteremia    Cardiac arrest (HCC)    Cardiac LV ejection fraction 30-35%    Cataract    Compression of lumbar vertebra (HCC)    Coronary artery disease    Dyspnea on exertion    ED (erectile dysfunction)    Elevated prostate specific antigen (PSA)    GERD (gastroesophageal reflux disease)    Glaucoma primary, open angle    BOTH EYES   H/O ventricular tachycardia    Hepatitis C virus infection    History of discitis    Hyperlipidemia    Hypertension    Hypertrophy of prostate with urinary obstruction    AND LOWER URINARY TRACT SYMPTOMS   Hypokinesis    Liver disease    Macular hole of left eye    Non-ST elevation MI (NSTEMI) (HCC)    Pre-diabetes    Pseudophakia of left eye    Retinal detachment    Syncope    Systolic CHF, chronic (HCC)    Thrombus    laa    Past Surgical History:  Procedure Laterality  Date   AV NODE ABLATION     CARDIAC CATHETERIZATION     CATARACT EXTRACTION     CHOLECYSTECTOMY     CORONARY ANGIOPLASTY WITH STENT PLACEMENT     CORONARY ARTERY BYPASS GRAFT     INSERT / REPLACE / REMOVE PACEMAKER     RIGHT/LEFT HEART CATH AND CORONARY/GRAFT ANGIOGRAPHY N/A 11/23/2018   Procedure: RIGHT/LEFT HEART CATH AND CORONARY/GRAFT ANGIOGRAPHY;  Surgeon: Kathleene Hazel, MD;  Location: [facility] INVAS[facility] LAB;  Service: Cardiovascular;  Laterality: N/A;   SKIN BIOPSY     YAG LASER APPLICATION      Family History  Problem Relation Age of Onset   Cancer - Other Sister        BREAST    Social History   Socioeconomic History   Marital status: Married    Spouse name: Not on file   Number of children: Not on file   Years of education: Not on file   Highest education level: Not  on file  Occupational History   Occupation: Retired-accountant  Social Needs   Financial resource strain: Not on file   Food insecurity:    Worry: Not on file    Inability: Not on file   Transportation needs:    Medical: Not on file    Non-medical: Not on file  Tobacco Use   Smoking status: Former Smoker    Packs/day: 2.00    Years: 40.00    Pack years: 80.00    Types: Cigarettes    Last attempt to quit: 09/06/1983    Years since quitting: 35.3   Smokeless tobacco: Never Used  Substance and Sexual Activity   Alcohol use: Yes    Alcohol/week: 2.0 standard drinks    Types: 1 Glasses of wine, 1 Cans of beer per week   Drug use: Not on file   Sexual activity: Not on file  Lifestyle   Physical activity:    Days per week: Not on file    Minutes per session: Not on file   Stress: Not on file  Relationships   Social connections:    Talks on phone: Not on file    Gets together: Not on file    Attends religious service: Not on file    Active member of club or organization: Not on file    Attends meetings of clubs or organizations: Not on file    Relationship status: Not on file   Intimate partner violence:    Fear of current or ex partner: Not on file    Emotionally abused: Not on file    Physically abused: Not on file    Forced sexual activity: Not on file  Other Topics Concern   Not on file  Social History Narrative   Not on file    Current Outpatient Medications  Medication Sig Dispense Refill   aspirin EC 81 MG tablet Take 81 mg by mouth daily.     brimonidine (ALPHAGAN) 0.2 % ophthalmic solution Place 1 drop into both eyes 2 (two) times a day.      carvedilol (COREG) 12.5 MG tablet Take 12.5 mg by mouth 2 (two) times daily with a meal.      Cholecalciferol (VITAMIN D) 50 MCG (2000 UT) CAPS Take 2,000 Units by mouth every other day.      dorzolamide (TRUSOPT) 2 % ophthalmic solution Place 1 drop into both eyes 3 (three) times daily.     famotidine  (PEPCID) 20 MG tablet Take 20 mg  by mouth daily.     furosemide (LASIX) 40 MG tablet Take 40 mg by mouth daily.     latanoprost (XALATAN) 0.005 % ophthalmic solution Place 1 drop into both eyes at bedtime.     lisinopril (PRINIVIL,ZESTRIL) 40 MG tablet Take 40 mg by mouth every morning.      magnesium oxide (MAG-OX) 400 MG tablet Take 400 mg by mouth every other day.     Multiple Vitamin (MULTIVITAMIN) tablet Take 1 tablet by mouth daily.     nitroGLYCERIN (NITROSTAT) 0.4 MG SL tablet Place 0.4 mg under the tongue every 5 (five) minutes as needed for chest pain.     pravastatin (PRAVACHOL) 80 MG tablet Take 40 mg by mouth at bedtime.      ranolazine (RANEXA) 500 MG 12 hr tablet Take 500 mg by mouth 2 (two) times daily.     warfarin (COUMADIN) 5 MG tablet Take 2.5-5 mg by mouth See admin instructions. 5 mg M F, 2.5 mg all other days     No current facility-administered medications for this visit.     Allergies  Allergen Reactions   Anesthetics, Ester Other (See Comments)    GI UPSET      Review of Systems:   General:  normal appetite, decreased energy, + weight gain, no weight loss, no fever  Cardiac:  no chest pain with exertion, no chest pain at rest, +SOB with any exertion, + resting SOB, no PND, no orthopnea, no palpitations, + arrhythmia, + chronic atrial fibrillation, + LE edema, + dizzy spells, no syncope  Respiratory:  + shortness of breath, no home oxygen, no productive cough, + dry cough, no bronchitis, no wheezing, no hemoptysis, no asthma, no pain with inspiration or cough, no sleep apnea, no CPAP at night  GI:   no difficulty swallowing, no reflux, no frequent heartburn, no hiatal hernia, no abdominal pain, no constipation, no diarrhea, no hematochezia, no hematemesis, no melena  GU:   no dysuria,  no frequency, no urinary tract infection, no hematuria, no enlarged prostate, no kidney stones, no kidney disease  Vascular:  no pain suggestive of claudication, no pain  in feet, no leg cramps, no varicose veins, no DVT, no non-healing foot ulcer  Neuro:   no stroke, no TIA's, no seizures, no headaches, no temporary blindness one eye,  no slurred speech, no peripheral neuropathy, no chronic pain, no instability of gait, no memory/cognitive dysfunction  Musculoskeletal: no arthritis, + joint swelling, no myalgias, no difficulty walking, normal mobility   Skin:   no rash, no itching, no skin infections, no pressure sores or ulcerations  Psych:   no anxiety, no depression, no nervousness, + unusual recent stress  Eyes:   no blurry vision, no floaters, no recent vision changes, + wears glasses   ENT:   no hearing loss, no loose or painful teeth, + dentures,   Hematologic:  no easy bruising, no abnormal bleeding, no clotting disorder, no frequent epistaxis  Endocrine:  no diabetes, does not check CBG's at home      Physical Exam:   BP 105/69    Pulse 86    Temp (!) 97.5 F (36.4 C) (Skin)    Resp 20    Ht 5\' 6"  (1.676 m)    Wt 179 lb (81.2 kg)    SpO2 97% Comment: RA   BMI 28.89 kg/m   General:  Elderly but  well-appearing  HEENT:  Unremarkable, NCAT, PERLA, EOMI,  Neck:   no JVD, no  bruits, no adenopathy or thyromegaly  Chest:   clear to auscultation, symmetrical breath sounds, no wheezes, no rhonchi   CV:   RRR, grade III/VI crescendo/decrescendo murmur heard best at RSB,  no diastolic murmur  Abdomen:  soft, non-tender, no masses or organomegaly  Extremities:  warm, well-perfused, pulses palpable in feet, left leg vein harvest scars, no LE edema  Rectal/GU  Deferred  Neuro:   Grossly non-focal and symmetrical throughout  Skin:   Clean and dry, no rashes, no breakdown   Diagnostic Tests:  Physicians   Panel Physicians Referring Physician Case Authorizing Physician  Kathleene Hazel, MD (Primary)    Procedures   RIGHT/LEFT HEART CATH AND CORONARY/GRAFT ANGIOGRAPHY  Conclusion     Prox RCA lesion is 99% stenosed.  Prox RCA to Mid RCA  lesion is 100% stenosed.  SVG graft was visualized by angiography.  Origin lesion is 100% stenosed.  Mid LM to Dist LM lesion is 99% stenosed.  SVG graft was visualized by angiography and is normal in caliber.  Prox Graft lesion is 10% stenosed.  Mid Graft lesion is 30% stenosed.  SVG graft was visualized by angiography and is normal in caliber.  LIMA graft was visualized by angiography and is normal in caliber.   1. Severe triple vessel CAD s/p 4V CABG with 3/4 patent grafts 2. Severe low flow, low gradient aortic stenosis by echo (cath with mean gradient 8.6 mmHg, peak to peak gradient 14 mmHg, AVA 1.73 cm2, cardiac output 3.88 L/min, cardiac index 2.03)  Recommendations: Continue workup for TAVR   Recommendations   Antiplatelet/Anticoag Continue workup for TAVR when Covid 19 precautions are lifted.  Indications   Coronary artery disease involving native coronary artery of native heart without angina pectoris [I25.10 (ICD-10-CM)]  Severe aortic stenosis [I35.0 (ICD-10-CM)]  Procedural Details   Technical Details Indication: CAD without angina, severe aortic stenosis  Procedure: The risks, benefits, complications, treatment options, and expected outcomes were discussed with the patient. The patient and/or family concurred with the proposed plan, giving informed consent. The patient was brought to the cath lab after IV hydration was given. The patient was sedated with Versed and Fentanyl. The right groin was prepped and draped in the usual manner. Using the modified Seldinger access technique, a 5 French sheath was placed in the right femoral artery and a 7 French sheath was placed in the right femoral vein using u/s guidance. Of note, unable to pass the pacemaker wires from right antecubital vein approach with SWAN. Standard diagnostic catheters were used to perform selective coronary angiography. I engaged the RCA, and the vein grafts to the LAD and intermediate artery with the  JR4 catheter. I engaged the SVG to PDA with a RCB catheter. I engaged the LIMA graft with a LIMA catheter. Left main engaged with a JL4 catheter. I crossed the aortic valve with an AL-2 catheter and a straight wire. LV pressures measured.   There were no immediate complications. The patient was taken to the recovery area in stable condition.   Estimated blood loss <50 mL.   During this procedure medications were administered to achieve and maintain moderate conscious sedation while the patient's heart rate, blood pressure, and oxygen saturation were continuously monitored and I was present face-to-face 100% of this time.  Medications  (Filter: Administrations occurring from 11/23/18 0816 to 11/23/18 0930)  Medication Rate/Dose/Volume Action  Date Time   midazolam (VERSED) injection (mg) 1 mg Given 11/23/18 0826   Total dose as of 01/19/19  0829 1 mg Given 0838   2 mg        fentaNYL (SUBLIMAZE) injection (mcg) 25 mcg Given 11/23/18 0827   Total dose as of 01/19/19 0829 25 mcg Given 0838   50 mcg        lidocaine (PF) (XYLOCAINE) 1 % injection (mL) 2 mL Given 11/23/18 0833   Total dose as of 01/19/19 0829 15 mL Given 0842   17 mL        Heparin (Porcine) in NaCl 1000-0.9 UT/500ML-% SOLN (mL) 500 mL Given 11/23/18 0859   Total dose as of 01/19/19 0829        500 mL        iohexol (OMNIPAQUE) 350 MG/ML injection (mL) 80 mL Given 11/23/18 0920   Total dose as of 01/19/19 0829        80 mL        Heparin (Porcine) in NaCl 1000-0.9 UT/500ML-% SOLN (mL) 500 mL Given 11/23/18 0918   Total dose as of 01/19/19 0829        500 mL        Sedation Time   Sedation Time Physician-1: 57 minutes 52 seconds  Complications   Complications documented before study signed (11/23/2018 9:36 AM EDT)    RIGHT/LEFT HEART CATH AND CORONARY/GRAFT ANGIOGRAPHY   None Documented by Kathleene HazelMcAlhany, Christopher D, MD 11/23/2018 9:33 AM EDT  Time Range: Intraprocedure      Coronary Findings   Diagnostic    Dominance: Right  Left Main  Mid LM to Dist LM lesion 99% stenosed  Mid LM to Dist LM lesion is 99% stenosed.  Left Anterior Descending  Vessel is large.  First Diagonal Branch  Vessel is moderate in size.  Left Circumflex  Vessel is large.  First Obtuse Marginal Branch  Vessel is large in size.  Right Coronary Artery  Vessel is large.  Prox RCA lesion 99% stenosed  Prox RCA lesion is 99% stenosed.  Prox RCA to Mid RCA lesion 100% stenosed  Prox RCA to Mid RCA lesion is 100% stenosed. The lesion is chronically occluded.  Right Posterior Atrioventricular Branch  Collaterals  Post Atrio filled by collaterals from 3rd Sept.    saphenous Graft to Dist RCA  SVG graft was visualized by angiography. Chronic occlusion  Origin lesion 100% stenosed  Origin lesion is 100% stenosed. The lesion is chronically occluded.  saphenous Graft to 1st Mrg  SVG graft was visualized by angiography and is normal in caliber.  Prox Graft lesion 10% stenosed  Prox Graft lesion is 10% stenosed. The lesion was previously treated using a stent (unknown type) between 1-2 years ago.  Mid Graft lesion 30% stenosed  Mid Graft lesion is 30% stenosed.  saphenous Graft to Mid LAD  SVG graft was visualized by angiography and is normal in caliber.  LIMA LIMA Graft to Ost 1st Diag  LIMA graft was visualized by angiography and is normal in caliber.  Intervention   No interventions have been documented.  Coronary Diagrams   Diagnostic  Dominance: Right    Intervention   Implants    No implant documentation for this case.  Syngo Images   Show images for CARDIAC CATHETERIZATION  Images on Long Term Storage   Show images for Alexander MtManning, Jamorian W   Link to Procedure Log   Procedure Log    Hemo Data    Most Recent Value  Fick Cardiac Output 3.88 L/min  Fick Cardiac Output Index 2.03 (L/min)/BSA  Aortic Mean  Gradient 8.63 mmHg  Aortic Peak Gradient 14 mmHg  Aortic Valve Area 1.73  Aortic Value  Area Index 0.9 cm2/BSA  RA A Wave 11 mmHg  RA V Wave 15 mmHg  RA Mean 12 mmHg  RV Systolic Pressure 75 mmHg  RV Diastolic Pressure 2 mmHg  RV EDP 11 mmHg  PA Systolic Pressure 75 mmHg  PA Diastolic Pressure 33 mmHg  PA Mean 48 mmHg  PW A Wave 23 mmHg  PW V Wave 27 mmHg  PW Mean 26 mmHg  AO Systolic Pressure 121 mmHg  AO Diastolic Pressure 65 mmHg  AO Mean 86 mmHg  LV Systolic Pressure 152 mmHg  LV Diastolic Pressure 21 mmHg  LV EDP 25 mmHg  AOp Systolic Pressure 139 mmHg  AOp Diastolic Pressure 74 mmHg  AOp Mean Pressure 99 mmHg  LVp Systolic Pressure 153 mmHg  LVp Diastolic Pressure 18 mmHg  LVp EDP Pressure 24 mmHg  QP/QS 1    Patient Name:   KAELEN CAUGHLIN Date of Exam: 01/10/2019 Medical Rec #:  [id number]         Height:       66.0 in Accession #:    [id number]        Weight:       180.0 lb Date of Birth:  1935-04-02         BSA:          1.91 m Patient Age:    83 years          BP:           127/81 mmHg Patient Gender: M                 HR:           77 bpm. Exam Location:  Outpatient    Procedure: 2D Echo, Cardiac Doppler and Color Doppler  Indications:    Pre TAVR   History:        Patient has prior history of Echocardiogram examinations, most                 recent 05/03/2018. CHF CAD and Previous Myocardial Infarction                 Signs/Symptoms: Dyspnea and Syncope Risk Factors: Hypertension,                 Dyslipidemia and GERD.   Sonographer:    Tiffany Dance Referring Phys: Verne Carrow, D  IMPRESSIONS    1. The left ventricle has a visually estimated ejection fraction of 25%. The cavity size was moderately dilated. Left ventricular diastolic Doppler parameters are indeterminate.  2. Diffuse hypokinesis worse in the inferior wall and septum.  3. The right ventricle has normal systolic function. The cavity was normal. There is no increase in right ventricular wall thickness.  4. Left atrial size was severely dilated.  5. PFO small  present.  6. Right atrial size was moderately dilated.  7. Mild thickening of the mitral valve leaflet. Mild calcification of the mitral valve leaflet.  8. The aortic valve is tricuspid. Moderate thickening of the aortic valve. Moderate calcification of the aortic valve. Aortic valve regurgitation is mild by color flow Doppler. Severe stenosis of the aortic valve.  9. Severe low gradient low flow AS. 10. PFO.  FINDINGS  Left Ventricle: The left ventricle has a visually estimated ejection fraction of 25. The cavity size was moderately dilated. There is no increase in left ventricular  wall thickness. Left ventricular diastolic Doppler parameters are indeterminate.  Diffuse hypokinesis worse in the inferior wall and septum.  Right Ventricle: The right ventricle has normal systolic function. The cavity was normal. There is no increase in right ventricular wall thickness.  Left Atrium: Left atrial size was severely dilated.  Right Atrium: Right atrial size was moderately dilated. Right atrial pressure is estimated at 8 mmHg.  Interatrial Septum: PFO. PFO small present.  Pericardium: There is no evidence of pericardial effusion.  Mitral Valve: The mitral valve is normal in structure. Mild thickening of the mitral valve leaflet. Mild calcification of the mitral valve leaflet. Mitral valve regurgitation is mild by color flow Doppler.  Tricuspid Valve: The tricuspid valve is normal in structure. Tricuspid valve regurgitation is mild by color flow Doppler.  Aortic Valve: The aortic valve is tricuspid Moderate thickening of the aortic valve. Moderate calcification of the aortic valve. Aortic valve regurgitation is mild by color flow Doppler. There is Severe stenosis of the aortic valve, with a calculated  valve area of 0.53 cm. Severe low gradient low flow AS.  Pulmonic Valve: The pulmonic valve was grossly normal. Pulmonic valve regurgitation is mild by color flow Doppler.      +--------------+--------++  LEFT VENTRICLE            +--------------+--------++  PLAX 2D                   +--------------+--------++  LV EF:         25 %       +--------------+--------++  LVIDd:         5.90 cm    +--------------+--------++  LVIDs:         4.50 cm    +--------------+--------++  LV PW:         1.20 cm    +--------------+--------++  LV IVS:        1.10 cm    +--------------+--------++  LVOT diam:     2.20 cm    +--------------+--------++  LV SV:         81 ml      +--------------+--------++  LV SV Index:   40.95      +--------------+--------++  LVOT Area:     3.80 cm   +--------------+--------++                            +--------------+--------++  +---------------+---------++  RIGHT VENTRICLE             +---------------+---------++  RV S prime:     6.25 cm/s   +---------------+---------++  TAPSE (M-mode): 1.1 cm      +---------------+---------++  +---------------+--------++-----------++  LEFT ATRIUM               Index         +---------------+--------++-----------++  LA diam:        6.10 cm   3.19 cm/m    +---------------+--------++-----------++  LA Vol (A2C):   184.0 ml  96.22 ml/m   +---------------+--------++-----------++  LA Vol (A4C):   136.0 ml  71.12 ml/m   +---------------+--------++-----------++  LA Biplane Vol: 161.0 ml  84.19 ml/m   +---------------+--------++-----------++ +------------+---------++-----------++  RIGHT ATRIUM            Index         +------------+---------++-----------++  RA Area:     34.40 cm                +------------+---------++-----------++  RA Volume:   121.00 ml  63.28 ml/m   +------------+---------++-----------++  +------------------+------------++  AORTIC VALVE                      +------------------+------------++  AV Area (Vmax):    0.59 cm       +------------------+------------++  AV Area (Vmean):   0.56 cm       +------------------+------------++  AV Area (VTI):     0.53 cm        +------------------+------------++  AV Vmax:           304.75 cm/s    +------------------+------------++  AV Vmean:          202.250 cm/s   +------------------+------------++  AV VTI:            0.688 m        +------------------+------------++  AV Peak Grad:      37.1 mmHg      +------------------+------------++  AV Mean Grad:      19.8 mmHg      +------------------+------------++  LVOT Vmax:         47.35 cm/s     +------------------+------------++  LVOT Vmean:        29.950 cm/s    +------------------+------------++  LVOT VTI:          0.097 m        +------------------+------------++  LVOT/AV VTI ratio: 0.14           +------------------+------------++  AR PHT:            488 msec       +------------------+------------++   +-------------+-------++  AORTA                   +-------------+-------++  Ao Root diam: 3.40 cm   +-------------+-------++  Ao Asc diam:  3.10 cm   +-------------+-------++  +--------------+----------++  +---------------+-----------++  MITRAL VALVE                  TRICUSPID VALVE               +--------------+----------++  +---------------+-----------++  MV Area (PHT): 5.13 cm       TR Peak grad:   24.8 mmHg     +--------------+----------++  +---------------+-----------++  MV PHT:        42.92 msec     TR Vmax:        249.00 cm/s   +--------------+----------++  +---------------+-----------++  MV Decel Time: 148 msec     +--------------+----------++  +--------------+-------+ +--------------+-----------++  SHUNTS                   MV E velocity: 135.00 cm/s   +--------------+-------+ +--------------+-----------++  Systemic VTI:  0.10 m    MV A velocity: 40.20 cm/s    +--------------+-------+ +--------------+-----------++  Systemic Diam: 2.20 cm   MV E/A ratio:  3.36          +--------------+-------+ +--------------+-----------++    Charlton Haws MD Electronically signed by Charlton Haws MD Signature Date/Time: 01/10/2019/1:52:42  PM    ADDENDUM REPORT: 01/10/2019 17:30  CLINICAL DATA:  Aortic stenosis  EXAM: Cardiac TAVR CT  TECHNIQUE: The patient was scanned on a Siemens Force 192 slice scanner. A 120 kV retrospective scan was triggered in the descending thoracic aorta at 111 HU's. Gantry rotation speed was 270 msecs and collimation was .9 mm. No beta blockade or nitro were given. The 3D data set was reconstructed in 5% intervals of the R-R cycle.  Systolic and diastolic phases were analyzed on a dedicated work station using MPR, MIP and VRT modes. The patient received 80 cc of contrast.  FINDINGS: Aortic Valve: Tri leaflet and calcified with restricted leaflet motion RCC particularly calcified  Aorta: Moderate calcific atherosclerosis with no aneurysm  Sinotubular Junction: 26 mm There is dense calcification of the STJ especially near the RCA ostium where it is bulky and nodular  Ascending Thoracic Aorta: 32 mm  Aortic Arch: 31 mm  Descending Thoracic Aorta: 26 mm  Sinus of Valsalva Measurements:  Non-coronary: 30.3 mm  Right - coronary: 28.7 mm  Left - coronary: 31.8 mm  Coronary Artery Height above Annulus:  Left Main: 16.4 mm above annulus  Right Coronary: 15.6 mm above annulus  Virtual Basal Annulus Measurements:  Maximum/Minimum Diameter: 32.5 mm x 22.7 mm  Perimeter: 91 mm  Area: 614 mm  Coronary Arteries: Sufficient height above annulus for deployment but native LM and RCA occluded. Occluded SVG to RCA. Patent LIMA to diagonal, Patent SVG to OM1 and patent SVG to mid LAD  Optimum Fluoroscopic Angle for Delivery: LAO 8 Caudal 1 degree  IMPRESSION: 1. Calcified tri leaflet AV with annular area of 614 mm2 This is suitable for a 29 mm Sapien 3 valve However the dense nodular STJ calcium and relatively small sinuses may make deployment more difficult  2. Coronary arteries sufficient height above annulus for deployment but native RCA/LM occluded  Patent SVG to OM1, SVG to mid LAD Patent LIMA to D1 The native RCA and SVG to RCA are occluded  3. Optimum angiographic angle for deployment LAO 8 degrees Caudal 1 degree  4.  Cannot r/o thrombus in LAA no delayed images performed  5. Degraded images with pacing wire artifact and motion artifact but diagnostic for annular area  Charlton Haws   Electronically Signed   By: Charlton Haws M.D.   On: 01/10/2019 17:30   Addended by Wendall Stade, MD on 01/10/2019 5:33 PM    Study Result   EXAM: OVER-READ INTERPRETATION  CT CHEST  The following report is an over-read performed by radiologist Dr. Cleone Slim of [facility] on 01/10/2019. This over-read does not include interpretation of cardiac or coronary anatomy or pathology. The cardiac CTA interpretation by the cardiologist is attached.  COMPARISON:  None.  FINDINGS: Please see the separate concurrent chest CT angiogram report for details.  IMPRESSION: Please see the separate concurrent chest CT angiogram report for details.  Electronically Signed: By: Delbert Phenix M.D. On: 01/10/2019 12:54       CLINICAL DATA:  Severe symptomatic aortic stenosis with dyspnea on exertion. TAVR evaluation.  EXAM: CT ANGIOGRAPHY CHEST, ABDOMEN AND PELVIS  TECHNIQUE: Multidetector CT imaging through the chest, abdomen and pelvis was performed using the standard protocol during bolus administration of intravenous contrast. Multiplanar reconstructed images and MIPs were obtained and reviewed to evaluate the vascular anatomy.  CONTRAST:  OMNIPAQUE IOHEXOL 350 MG/ML SOLN  COMPARISON:  None.  FINDINGS: CTA CHEST FINDINGS  Cardiovascular: Moderate cardiomegaly. No significant pericardial effusion/thickening. Three-vessel coronary atherosclerosis status post CABG. Thickened and coarsely calcified aortic valve. Two lead right subclavian ICD is noted with lead tips in the right atrium and right  ventricular apex. Atherosclerotic nonaneurysmal thoracic aorta. Normal caliber pulmonary arteries. No central pulmonary emboli.  Mediastinum/Nodes: Hypodense bilateral thyroid lobe nodules, largest 0.2 cm on the right. Unremarkable esophagus. No pathologically enlarged axillary, mediastinal or hilar lymph nodes.  Lungs/Pleura: No pneumothorax. Trace dependent bilateral pleural effusions.  Mild interlobular septal thickening throughout both lungs. Mild patchy ground-glass opacity at the peripheral lung bases, left greater than right. No acute consolidative airspace disease or lung masses. Few scattered small solid pulmonary nodules in both lungs, largest 4 mm in the posterior left upper lobe (series 16/image 3).  Musculoskeletal: No aggressive appearing focal osseous lesions. Intact sternotomy wires. Moderate thoracic spondylosis.  CTA ABDOMEN AND PELVIS FINDINGS  Hepatobiliary: Finely irregular liver surface, cannot exclude cirrhosis. No liver masses. Cholecystectomy. No biliary ductal dilatation.  Pancreas: Normal, with no mass or duct dilation.  Spleen: Normal size. No mass.  Adrenals/Urinary Tract: Normal adrenals. Bilateral renal cortical scarring, moderate to severe on the left and mild on the right. No renal masses. No hydronephrosis. Normal bladder.  Stomach/Bowel: Normal non-distended stomach. Normal caliber small bowel with no small bowel wall thickening. Normal appendix. Mild diffuse colonic diverticulosis, with no large bowel wall thickening or significant pericolonic fat stranding.  Vascular/Lymphatic: Atherosclerotic abdominal aorta with ectatic 2.5 cm infrarenal abdominal aorta. No pathologically enlarged lymph nodes in the abdomen or pelvis.  Reproductive: Mildly enlarged prostate.  Other: No pneumoperitoneum. No focal fluid collection. Small volume and trace perihepatic ascites.  Musculoskeletal: No aggressive appearing focal osseous  lesions. Marked lumbar spondylosis. Ankylosis at L2-3.  VASCULAR MEASUREMENTS PERTINENT TO TAVR:  AORTA:  Minimal Aortic Diameter-12.0 x 10.0 mm  Severity of Aortic Calcification-severe  RIGHT PELVIS:  Right Common Iliac Artery -  Minimal Diameter-7.1 x 5.2 mm  Tortuosity-mild  Calcification-severe  Right External Iliac Artery -  Minimal Diameter-6.1 x 6.0 mm  Tortuosity-mild-to-moderate  Calcification-mild  Right Common Femoral Artery -  Minimal Diameter-4.8 x 4.1 mm  Tortuosity-mild  Calcification-severe  LEFT PELVIS:  Left Common Iliac Artery -  Minimal Diameter-8.4 x 4.6 mm  Tortuosity-mild  Calcification-severe  Left External Iliac Artery -  Minimal Diameter-7.9 x 7.9 mm  Tortuosity-mild  Calcification-mild  Left Common Femoral Artery -  Minimal Diameter-6.2 x 5.9 mm  Tortuosity-mild  Calcification-severe  Review of the MIP images confirms the above findings.  IMPRESSION: 1. Vascular findings and measurements pertinent to potential TAVR procedure, as detailed. 2. Severe thickening and calcification of the aortic valve, compatible with the provided history of severe symptomatic aortic stenosis. 3. Ectatic 2.5 cm infrarenal abdominal aorta, at risk for aneurysm development. Recommend follow-up aortic ultrasound in 5 years. This recommendation follows ACR consensus guidelines: White Paper of the ACR Incidental Findings Committee II on Vascular Findings. J Am Coll Radiol 2013; 03:491-791. 4.  Aortic Atherosclerosis (ICD10-I70.0). 5. Moderate cardiomegaly. Mild interlobular septal thickening and mild patchy ground-glass opacity in both lungs, favor mild pulmonary edema. Trace dependent bilateral pleural effusions. 6. Scattered small solid pulmonary nodules in both lungs, largest 4 mm. No follow-up needed if patient is low-risk (and has no known or suspected primary neoplasm). Non-contrast chest CT can be  considered in 12 months if patient is high-risk. This recommendation follows the consensus statement: Guidelines for Management of Incidental Pulmonary Nodules Detected on CT Images: From the Fleischner Society 2017; Radiology 2017; 284:228-243. 7. Finely irregular liver surface, cannot exclude hepatic cirrhosis. Consider hepatic elastography for further liver fibrosis risk stratification, as clinically warranted. 8. Trace perihepatic and small volume pelvic ascites. 9. Mildly enlarged prostate. 10. Mild colonic diverticulosis.   Electronically Signed   By: Delbert Phenix M.D.   On: 01/10/2019 13:36   STS Risk score:  Risk of Mortality: 5.407% Renal Failure: 6.472% Permanent Stroke: 1.923% Prolonged Ventilation: 16.937% DSW Infection: 0.263% Reoperation: 6.148% Morbidity or Mortality: 23.504%  Short Length of Stay: 16.129% Long Length of Stay: 12.995%   Impression:  This 84 year old gentleman has stage D, severe, symptomatic aortic stenosis with NYHA class III symptoms of exertional fatigue and shortness of breath with an LVEF of 25% consistent with chronic combined systolic and diastolic congestive heart failure. I have personally reviewed his echo, cath and CTA studies. His most recent echo showed a calcified aortic valve with poor leaflet mobility that looks like a severely stenotic valve although the mean gradient was only 20 mm Hg. His EF has decreased to 25% from 35-45% 6 months ago and I think he has low gradient, low flow, severe AS. His cath showed 3/4 patent grafts and a mean AV gradient of 9 mm Hg with a peak to peak of 14 mm Hg and severe pulmonary hypertension at that time. He only had mild MR by recent echo. I think TAVR is the best treatment to try to improve his congestive heart failure symptoms and prevent further LV deterioration. His gated cardiac CTA shows anatomy suitable for TAVR using a 29 mm Sapien 3 valve. There is some calcification of the sinotubular junction but it  is not circumferential. His abdominal and pelvic CTA shows severe aortailiac calcific atherosclerosis and the right common iliac has circumferential calcification with borderline diameter. The left common iliac has borderline diameter but the calcification is not circumferential so it may be possible to perform a transfemoral insertion on the left side. The left subclavian artery has an adequate diameter with some focal calcification at the origin but I think it is probably adequate. The left carotid artery is too small. Transapical insertion is not possible since there is an epicardial pacing lead at this location.   The patient and his wife were counseled at length regarding treatment alternatives for management of severe symptomatic aortic stenosis. The risks and benefits of surgical intervention has been discussed in detail. Long-term prognosis with medical therapy was discussed. Alternative approaches such as conventional surgical aortic valve replacement, transcatheter aortic valve replacement, and palliative medical therapy were compared and contrasted at length. This discussion was placed in the context of the patient's own specific clinical presentation and past medical history. All of their questions have been addressed.  Following the decision to proceed with transcatheter aortic valve replacement, a discussion was held regarding what types of management strategies would be attempted intraoperatively in the event of life-threatening complications, including whether or not the patient would be considered a candidate for the use of cardiopulmonary bypass and/or conversion to open sternotomy for attempted surgical intervention. The patient is aware of the fact that transient use of cardiopulmonary bypass may be necessary. With previous CABG and severe LV systolic dysfunction at 84 years old I would not consider him a candidate for sternotomy to address any intraop complications and he and his wife  understand that The patient has been advised of a variety of complications that might develop including but not limited to risks of death, stroke, paravalvular leak, aortic dissection or other major vascular complications, aortic annulus rupture, device embolization, cardiac rupture or perforation, mitral regurgitation, acute myocardial infarction, arrhythmia, heart block or bradycardia requiring permanent pacemaker placement, congestive heart failure, respiratory failure, renal failure, pneumonia, infection, other late complications related to structural valve deterioration or migration, or other complications that might ultimately cause a temporary or permanent loss of functional independence or other long term morbidity. The patient provides full informed consent for the procedure as described and all questions were answered.  Plan:  Left transfemoral or left subclavian TAVR on Tuesday 01/22/2019.   I spent 60 minutes performing this consultation and > 50% of this time was spent face to face counseling and coordinating the care of this patient's severe symptomatic aortic stenosis.     [name], MD 01/18/2019 11:03 AM

## 2019-01-19 LAB — NOVEL CORONAVIRUS, NAA (HOSP ORDER, SEND-OUT TO REF LAB; TAT 18-24 HRS): SARS-CoV-2, NAA: NOT DETECTED

## 2019-01-21 ENCOUNTER — Encounter: Payer: Self-pay | Admitting: Physician Assistant

## 2019-01-21 MED ORDER — MAGNESIUM SULFATE 50 % IJ SOLN
40.0000 meq | INTRAMUSCULAR | Status: DC
  Filled 2019-01-21: qty 9.85

## 2019-01-21 MED ORDER — SODIUM CHLORIDE 0.9 % IV SOLN
INTRAVENOUS | Status: DC
  Filled 2019-01-21: qty 30

## 2019-01-21 MED ORDER — VANCOMYCIN HCL 10 G IV SOLR
1250.0000 mg | INTRAVENOUS | Status: AC
  Administered 2019-01-22: 1250 mg via INTRAVENOUS
  Filled 2019-01-21: qty 1250

## 2019-01-21 MED ORDER — POTASSIUM CHLORIDE 2 MEQ/ML IV SOLN
80.0000 meq | INTRAVENOUS | Status: DC
  Filled 2019-01-21: qty 40

## 2019-01-21 MED ORDER — SODIUM CHLORIDE 0.9 % IV SOLN
1.5000 g | INTRAVENOUS | Status: AC
  Administered 2019-01-22: 13:00:00 1.5 g via INTRAVENOUS
  Filled 2019-01-21: qty 1.5

## 2019-01-21 MED ORDER — DEXMEDETOMIDINE HCL IN NACL 400 MCG/100ML IV SOLN
0.1000 ug/kg/h | INTRAVENOUS | Status: DC
  Filled 2019-01-21: qty 100

## 2019-01-21 MED ORDER — NOREPINEPHRINE BITARTRATE 1 MG/ML IV SOLN
0.0000 ug/min | INTRAVENOUS | Status: AC
  Administered 2019-01-22: 2 ug/min via INTRAVENOUS
  Filled 2019-01-21: qty 4

## 2019-01-21 NOTE — H&P (Signed)
[address]       [place] [id number]             [phone]      Cardiothoracic Surgery Admission History and Physical   Referring Provider is Day, Pernell Dupreemple V, MD Primary Cardiologist is No primary care provider on file. PCP is Day, Pernell Dupreemple V, MD      Chief Complaint  Patient presents with    Severe aortic stenosis        HPI:  The patient is an 84 year old gentleman with a history of HTN, hyperlipidemia, PAF on Coumadin ,ischemic cardiomyopathy and chronic systolic heart failure, s/p CABG x 4 (LIMA to LAD, SVG to ramus, SVG to Diagonal, SVG to PDA) at [facility], PCI of the OM1 in 2014 with DES, NSVT s/p ICD which had to be removed and replaced in 2016 due to enterococcal bacteremia and known severe aortic stenosis. He was seen by Dr. Adella HareApplegate at [facility] and underwent TAVR workup in 2015 but did not proceed with TAVR because he was told that it may not improve his symptoms. He has been followed at the [facility] in [place] since then. An echo in 04/2018 showed an EF of 35-45% with thickened and calcified aortic valve leaflets and a mean gradient of 27 mm Hg. The AVA was 0.44 cm2 with a DI of 0.15. He was referred to Dr. Clifton JamesMcAlhany for another opinion concerning the benefit of TAVR. Cardiac cath on 11/23/2018 showed severe 3-vessel CAD with 3/4 patent grafts. The mean gradient across the aortic valve was 8.6 mm Hg with a peak to peak gradient of 14. CI was 2.0. PA pressure at that time was 75/33 with a mean of 48 and an LVEDP of 25. His most recent echo on 01/10/2019 showed a mean gradient of 19.8 mm Hg wit a peak of 37.1 mm Hg with an AVA of 0.53 cm2. His LVEF has decreased to 25%.  The patient lives with his wife. He reports shortness of breath with moderate exertion and some fatigue but no chest pain. His wife says that he gets short of breath with any activity and she feels that he has progressively worsened over the past few months. He has some dizziness but no  syncope. He does get some swelling in his legs.      Past Medical History:  Diagnosis Date   Aortic stenosis    Arrhythmia    paroxysmal atrial fibrillation   Arthritis    Bacteremia    Cardiac arrest (HCC)    Cardiac LV ejection fraction 30-35%    Cataract    Compression of lumbar vertebra (HCC)    Coronary artery disease    Dyspnea on exertion    ED (erectile dysfunction)    Elevated prostate specific antigen (PSA)    GERD (gastroesophageal reflux disease)    Glaucoma primary, open angle    BOTH EYES   H/O ventricular tachycardia    Hepatitis C virus infection    History of discitis    Hyperlipidemia    Hypertension    Hypertrophy of prostate with urinary obstruction    AND LOWER URINARY TRACT SYMPTOMS   Hypokinesis    Liver disease    Macular hole of left eye    Non-ST elevation MI (NSTEMI) (HCC)    Pre-diabetes    Pseudophakia of left eye    Retinal detachment    Syncope    Systolic CHF, chronic (HCC)    Thrombus  laa         Past Surgical History:  Procedure Laterality Date   AV NODE ABLATION     CARDIAC CATHETERIZATION     CATARACT EXTRACTION     CHOLECYSTECTOMY     CORONARY ANGIOPLASTY WITH STENT PLACEMENT     CORONARY ARTERY BYPASS GRAFT     INSERT / REPLACE / REMOVE PACEMAKER     RIGHT/LEFT HEART CATH AND CORONARY/GRAFT ANGIOGRAPHY N/A 11/23/2018   Procedure: RIGHT/LEFT HEART CATH AND CORONARY/GRAFT ANGIOGRAPHY;  Surgeon: Kathleene Hazel, MD;  Location: [facility];  Service: Cardiovascular;  Laterality: N/A;   SKIN BIOPSY     YAG LASER APPLICATION           Family History  Problem Relation Age of Onset   Cancer - Other Sister        BREAST    Social History        Socioeconomic History   Marital status: Married    Spouse name: Not on file   Number of children: Not on file   Years of education: Not on file    Highest education level: Not on file  Occupational History   Occupation: Retired-accountant  Social Needs   Financial resource strain: Not on file   Food insecurity:    Worry: Not on file    Inability: Not on file   Transportation needs:    Medical: Not on file    Non-medical: Not on file  Tobacco Use   Smoking status: Former Smoker    Packs/day: 2.00    Years: 40.00    Pack years: 80.00    Types: Cigarettes    Last attempt to quit: 09/06/1983    Years since quitting: 35.3   Smokeless tobacco: Never Used  Substance and Sexual Activity   Alcohol use: Yes    Alcohol/week: 2.0 standard drinks    Types: 1 Glasses of wine, 1 Cans of beer per week   Drug use: Not on file   Sexual activity: Not on file  Lifestyle   Physical activity:    Days per week: Not on file    Minutes per session: Not on file   Stress: Not on file  Relationships   Social connections:    Talks on phone: Not on file    Gets together: Not on file    Attends religious service: Not on file    Active member of club or organization: Not on file    Attends meetings of clubs or organizations: Not on file    Relationship status: Not on file   Intimate partner violence:    Fear of current or ex partner: Not on file    Emotionally abused: Not on file    Physically abused: Not on file    Forced sexual activity: Not on file  Other Topics Concern   Not on file  Social History Narrative   Not on file          Current Outpatient Medications  Medication Sig Dispense Refill   aspirin EC 81 MG tablet Take 81 mg by mouth daily.     brimonidine (ALPHAGAN) 0.2 % ophthalmic solution Place 1 drop into both eyes 2 (two) times a day.      carvedilol (COREG) 12.5 MG tablet Take 12.5 mg by mouth 2 (two) times daily with a meal.      Cholecalciferol (VITAMIN D) 50 MCG (2000 UT) CAPS Take 2,000 Units by mouth every other day.  dorzolamide  (TRUSOPT) 2 % ophthalmic solution Place 1 drop into both eyes 3 (three) times daily.     famotidine (PEPCID) 20 MG tablet Take 20 mg by mouth daily.     furosemide (LASIX) 40 MG tablet Take 40 mg by mouth daily.     latanoprost (XALATAN) 0.005 % ophthalmic solution Place 1 drop into both eyes at bedtime.     lisinopril (PRINIVIL,ZESTRIL) 40 MG tablet Take 40 mg by mouth every morning.      magnesium oxide (MAG-OX) 400 MG tablet Take 400 mg by mouth every other day.     Multiple Vitamin (MULTIVITAMIN) tablet Take 1 tablet by mouth daily.     nitroGLYCERIN (NITROSTAT) 0.4 MG SL tablet Place 0.4 mg under the tongue every 5 (five) minutes as needed for chest pain.     pravastatin (PRAVACHOL) 80 MG tablet Take 40 mg by mouth at bedtime.      ranolazine (RANEXA) 500 MG 12 hr tablet Take 500 mg by mouth 2 (two) times daily.     warfarin (COUMADIN) 5 MG tablet Take 2.5-5 mg by mouth See admin instructions. 5 mg M F, 2.5 mg all other days     No current facility-administered medications for this visit.          Allergies  Allergen Reactions   Anesthetics, Ester Other (See Comments)    GI UPSET      Review of Systems:              General:                      normal appetite, decreased energy, + weight gain, no weight loss, no fever             Cardiac:                       no chest pain with exertion, no chest pain at rest, +SOB with any exertion, + resting SOB, no PND, no orthopnea, no palpitations, + arrhythmia, + chronic atrial fibrillation, + LE edema, + dizzy spells, no syncope             Respiratory:                 + shortness of breath, no home oxygen, no productive cough, + dry cough, no bronchitis, no wheezing, no hemoptysis, no asthma, no pain with inspiration or cough, no sleep apnea, no CPAP at night             GI:                               no difficulty swallowing, no reflux, no frequent heartburn, no hiatal hernia, no abdominal  pain, no constipation, no diarrhea, no hematochezia, no hematemesis, no melena             GU:                              no dysuria,  no frequency, no urinary tract infection, no hematuria, no enlarged prostate, no kidney stones, no kidney disease             Vascular:                     no pain suggestive of claudication, no pain in feet,  no leg cramps, no varicose veins, no DVT, no non-healing foot ulcer             Neuro:                         no stroke, no TIA's, no seizures, no headaches, no temporary blindness one eye,  no slurred speech, no peripheral neuropathy, no chronic pain, no instability of gait, no memory/cognitive dysfunction             Musculoskeletal:         no arthritis, + joint swelling, no myalgias, no difficulty walking, normal mobility              Skin:                            no rash, no itching, no skin infections, no pressure sores or ulcerations             Psych:                         no anxiety, no depression, no nervousness, + unusual recent stress             Eyes:                           no blurry vision, no floaters, no recent vision changes, + wears glasses              ENT:                            no hearing loss, no loose or painful teeth, + dentures,              Hematologic:               no easy bruising, no abnormal bleeding, no clotting disorder, no frequent epistaxis             Endocrine:                   no diabetes, does not check CBG's at home                            Physical Exam:              BP 105/69    Pulse 86    Temp (!) 97.5 F (36.4 C) (Skin)    Resp 20    Ht 5\' 6"  (1.676 m)    Wt 179 lb (81.2 kg)    SpO2 97% Comment: RA   BMI 28.89 kg/m              General:                      Elderly but  well-appearing             HEENT:                       Unremarkable, NCAT, PERLA, EOMI,             Neck:  no JVD, no bruits, no adenopathy or thyromegaly             Chest:                           clear to auscultation, symmetrical breath sounds, no wheezes, no rhonchi              CV:                              RRR, grade III/VI crescendo/decrescendo murmur heard best at RSB,  no diastolic murmur             Abdomen:                    soft, non-tender, no masses or organomegaly             Extremities:                 warm, well-perfused, pulses palpable in feet, left leg vein harvest scars, no LE edema             Rectal/GU                   Deferred             Neuro:                         Grossly non-focal and symmetrical throughout             Skin:                            Clean and dry, no rashes, no breakdown   Diagnostic Tests:  Physicians   Panel Physicians Referring Physician Case Authorizing Physician  Kathleene Hazel, MD (Primary)    Procedures   RIGHT/LEFT HEART CATH AND CORONARY/GRAFT ANGIOGRAPHY  Conclusion     Prox RCA lesion is 99% stenosed.  Prox RCA to Mid RCA lesion is 100% stenosed.  SVG graft was visualized by angiography.  Origin lesion is 100% stenosed.  Mid LM to Dist LM lesion is 99% stenosed.  SVG graft was visualized by angiography and is normal in caliber.  Prox Graft lesion is 10% stenosed.  Mid Graft lesion is 30% stenosed.  SVG graft was visualized by angiography and is normal in caliber.  LIMA graft was visualized by angiography and is normal in caliber.  1. Severe triple vessel CAD s/p 4V CABG with 3/4 patent grafts 2. Severe low flow, low gradient aortic stenosis by echo (cath with mean gradient 8.6 mmHg, peak to peak gradient 14 mmHg, AVA 1.73 cm2, cardiac output 3.88 L/min, cardiac index 2.03)  Recommendations: Continue workup for TAVR   Recommendations   Antiplatelet/Anticoag Continue workup for TAVR when Covid 19 precautions are lifted.  Indications   Coronary artery disease involving native coronary artery of native heart without angina pectoris [I25.10 (ICD-10-CM)]  Severe aortic  stenosis [I35.0 (ICD-10-CM)]  Procedural Details   Technical Details Indication: CAD without angina, severe aortic stenosis  Procedure: The risks, benefits, complications, treatment options, and expected outcomes were discussed with the patient. The patient and/or family concurred with the proposed plan, giving informed consent. The patient was brought to the cath lab after IV hydration was given. The patient was sedated with Versed and Fentanyl. The  right groin was prepped and draped in the usual manner. Using the modified Seldinger access technique, a 5 French sheath was placed in the right femoral artery and a 7 French sheath was placed in the right femoral vein using u/s guidance. Of note, unable to pass the pacemaker wires from right antecubital vein approach with SWAN. Standard diagnostic catheters were used to perform selective coronary angiography. I engaged the RCA, and the vein grafts to the LAD and intermediate artery with the JR4 catheter. I engaged the SVG to PDA with a RCB catheter. I engaged the LIMA graft with a LIMA catheter. Left main engaged with a JL4 catheter. I crossed the aortic valve with an AL-2 catheter and a straight wire. LV pressures measured.   There were no immediate complications. The patient was taken to the recovery area in stable condition.   Estimated blood loss <50 mL.   During this procedure medications were administered to achieve and maintain moderate conscious sedation while the patient's heart rate, blood pressure, and oxygen saturation were continuously monitored and I was present face-to-face 100% of this time.  Medications  (Filter: Administrations occurring from 11/23/18 0816 to 11/23/18 0930)          Medication Rate/Dose/Volume Action  Date Time   midazolam (VERSED) injection (mg) 1 mg Given 11/23/18 0826   Total dose as of 01/19/19 0829 1 mg Given 0838   2 mg        fentaNYL (SUBLIMAZE) injection (mcg) 25 mcg Given 11/23/18 0827   Total  dose as of 01/19/19 0829 25 mcg Given 0838   50 mcg        lidocaine (PF) (XYLOCAINE) 1 % injection (mL) 2 mL Given 11/23/18 0833   Total dose as of 01/19/19 0829 15 mL Given 0842   17 mL        Heparin (Porcine) in NaCl 1000-0.9 UT/500ML-% SOLN (mL) 500 mL Given 11/23/18 0859   Total dose as of 01/19/19 0829        500 mL        iohexol (OMNIPAQUE) 350 MG/ML injection (mL) 80 mL Given 11/23/18 0920   Total dose as of 01/19/19 0829        80 mL        Heparin (Porcine) in NaCl 1000-0.9 UT/500ML-% SOLN (mL) 500 mL Given 11/23/18 0918   Total dose as of 01/19/19 0829        500 mL        Sedation Time   Sedation Time Physician-1: 57 minutes 52 seconds  Complications   Complications documented before study signed (11/23/2018 9:36 AM EDT)    RIGHT/LEFT HEART CATH AND CORONARY/GRAFT ANGIOGRAPHY   None Documented by Kathleene Hazel, MD 11/23/2018 9:33 AM EDT  Time Range: Intraprocedure      Coronary Findings   Diagnostic  Dominance: Right  Left Main  Mid LM to Dist LM lesion 99% stenosed  Mid LM to Dist LM lesion is 99% stenosed.  Left Anterior Descending  Vessel is large.  First Diagonal Branch  Vessel is moderate in size.  Left Circumflex  Vessel is large.  First Obtuse Marginal Branch  Vessel is large in size.  Right Coronary Artery  Vessel is large.  Prox RCA lesion 99% stenosed  Prox RCA lesion is 99% stenosed.  Prox RCA to Mid RCA lesion 100% stenosed  Prox RCA to Mid RCA lesion is 100% stenosed. The lesion is chronically occluded.  Right Posterior Atrioventricular Branch  Collaterals  Post Atrio filled by collaterals from 3rd Sept.    saphenous Graft to Dist RCA  SVG graft was visualized by angiography. Chronic occlusion  Origin lesion 100% stenosed  Origin lesion is 100% stenosed. The lesion is chronically occluded.  saphenous Graft to 1st Mrg  SVG graft was visualized by angiography and is  normal in caliber.  Prox Graft lesion 10% stenosed  Prox Graft lesion is 10% stenosed. The lesion was previously treated using a stent (unknown type) between 1-2 years ago.  Mid Graft lesion 30% stenosed  Mid Graft lesion is 30% stenosed.  saphenous Graft to Mid LAD  SVG graft was visualized by angiography and is normal in caliber.  LIMA LIMA Graft to Ost 1st Diag  LIMA graft was visualized by angiography and is normal in caliber.  Intervention   No interventions have been documented.  Coronary Diagrams   Diagnostic  Dominance: Right    Intervention   Implants    No implant documentation for this case.  Syngo Images   Show images for CARDIAC CATHETERIZATION  Images on Long Term Storage   Show images for Ord[place], Prichett to Procedure Log   Procedure Log    Hemo Data    Most Recent Value  Fick Cardiac Output 3.88 L/min  Fick Cardiac Output Index 2.03 (L/min)/BSA  Aortic Mean Gradient 8.63 mmHg  Aortic Peak Gradient 14 mmHg  Aortic Valve Area 1.73  Aortic Value Area Index 0.9 cm2/BSA  RA A Wave 11 mmHg  RA V Wave 15 mmHg  RA Mean 12 mmHg  RV Systolic Pressure 75 mmHg  RV Diastolic Pressure 2 mmHg  RV EDP 11 mmHg  PA Systolic Pressure 75 mmHg  PA Diastolic Pressure 33 mmHg  PA Mean 48 mmHg  PW A Wave 23 mmHg  PW V Wave 27 mmHg  PW Mean 26 mmHg  AO Systolic Pressure 121 mmHg  AO Diastolic Pressure 65 mmHg  AO Mean 86 mmHg  LV Systolic Pressure 152 mmHg  LV Diastolic Pressure 21 mmHg  LV EDP 25 mmHg  AOp Systolic Pressure 139 mmHg  AOp Diastolic Pressure 74 mmHg  AOp Mean Pressure 99 mmHg  LVp Systolic Pressure 153 mmHg  LVp Diastolic Pressure 18 mmHg  LVp EDP Pressure 24 mmHg  QP/QS 1    Patient Name: CURLEE BOGAN Date of Exam: 01/10/2019 Medical Rec #: [id number] Height: 66.0 in Accession #: [id number] Weight: 180.0 lb Date of Birth: 09-23-1934 BSA: 1.91 m Patient Age: 9  years BP: 127/81 mmHg Patient Gender: M HR: 77 bpm. Exam Location: Outpatient   Procedure: 2D Echo, Cardiac Doppler and Color Doppler  Indications: Pre TAVR  History: Patient has prior history of Echocardiogram examinations, most recent 05/03/2018. CHF CAD and Previous Myocardial Infarction Signs/Symptoms: Dyspnea and Syncope Risk Factors: Hypertension, Dyslipidemia and GERD.  Sonographer: Tiffany Dance Referring Phys: Verne Carrow, D  IMPRESSIONS   1. The left ventricle has a visually estimated ejection fraction of 25%. The cavity size was moderately dilated. Left ventricular diastolic Doppler parameters are indeterminate. 2. Diffuse hypokinesis worse in the inferior wall and septum. 3. The right ventricle has normal systolic function. The cavity was normal. There is no increase in right ventricular wall thickness. 4. Left atrial size was severely dilated. 5. PFO small present. 6. Right atrial size was moderately dilated. 7. Mild thickening of the mitral valve leaflet. Mild calcification of the mitral valve leaflet. 8. The aortic valve is tricuspid. Moderate thickening of the aortic valve.  Moderate calcification of the aortic valve. Aortic valve regurgitation is mild by color flow Doppler. Severe stenosis of the aortic valve. 9. Severe low gradient low flow AS. 10. PFO.  FINDINGS Left Ventricle: The left ventricle has a visually estimated ejection fraction of 25. The cavity size was moderately dilated. There is no increase in left ventricular wall thickness. Left ventricular diastolic Doppler parameters are indeterminate.  Diffuse hypokinesis worse in the inferior wall and septum.  Right Ventricle: The right ventricle has normal systolic function. The cavity was normal. There is no increase in right ventricular wall thickness.  Left Atrium: Left  atrial size was severely dilated.  Right Atrium: Right atrial size was moderately dilated. Right atrial pressure is estimated at 8 mmHg.  Interatrial Septum: PFO. PFO small present.  Pericardium: There is no evidence of pericardial effusion.  Mitral Valve: The mitral valve is normal in structure. Mild thickening of the mitral valve leaflet. Mild calcification of the mitral valve leaflet. Mitral valve regurgitation is mild by color flow Doppler.  Tricuspid Valve: The tricuspid valve is normal in structure. Tricuspid valve regurgitation is mild by color flow Doppler.  Aortic Valve: The aortic valve is tricuspid Moderate thickening of the aortic valve. Moderate calcification of the aortic valve. Aortic valve regurgitation is mild by color flow Doppler. There is Severe stenosis of the aortic valve, with a calculated  valve area of 0.53 cm. Severe low gradient low flow AS.  Pulmonic Valve: The pulmonic valve was grossly normal. Pulmonic valve regurgitation is mild by color flow Doppler.   +--------------+--------++  LEFT VENTRICLE     +--------------+--------++  PLAX 2D      +--------------+--------++  LV EF:  25 %    +--------------+--------++  LVIDd:  5.90 cm    +--------------+--------++  LVIDs:  4.50 cm    +--------------+--------++  LV PW:  1.20 cm    +--------------+--------++  LV IVS:  1.10 cm    +--------------+--------++  LVOT diam:  2.20 cm    +--------------+--------++  LV SV:  81 ml    +--------------+--------++  LV SV Index:  40.95    +--------------+--------++  LVOT Area:  3.80 cm   +--------------+--------++        +--------------+--------++  +---------------+---------++  RIGHT VENTRICLE     +---------------+---------++  RV S prime:  6.25 cm/s   +---------------+---------++  TAPSE (M-mode): 1.1 cm     +---------------+---------++  +---------------+--------++-----------++  LEFT ATRIUM     Index    +---------------+--------++-----------++  LA diam:  6.10 cm   3.19 cm/m    +---------------+--------++-----------++  LA Vol (A2C):  184.0 ml  96.22 ml/m   +---------------+--------++-----------++  LA Vol (A4C):  136.0 ml  71.12 ml/m   +---------------+--------++-----------++  LA Biplane Vol: 161.0 ml  84.19 ml/m   +---------------+--------++-----------++ +------------+---------++-----------++  RIGHT ATRIUM    Index    +------------+---------++-----------++  RA Area:  34.40 cm      +------------+---------++-----------++  RA Volume:  121.00 ml  63.28 ml/m   +------------+---------++-----------++ +------------------+------------++  AORTIC VALVE      +------------------+------------++  AV Area (Vmax):  0.59 cm    +------------------+------------++  AV Area (Vmean):  0.56 cm    +------------------+------------++  AV Area (VTI):  0.53 cm    +------------------+------------++  AV Vmax:  304.75 cm/s    +------------------+------------++  AV Vmean:  202.250 cm/s   +------------------+------------++  AV VTI:  0.688 m    +------------------+------------++  AV Peak Grad:  37.1 mmHg    +------------------+------------++  AV Mean Grad:  19.8 mmHg    +------------------+------------++  LVOT Vmax:  47.35 cm/s    +------------------+------------++  LVOT Vmean:  29.950 cm/s    +------------------+------------++  LVOT VTI:  0.097 m    +------------------+------------++  LVOT/AV VTI ratio: 0.14    +------------------+------------++  AR PHT:  488 msec    +------------------+------------++  +-------------+-------++  AORTA      +-------------+-------++  Ao Root diam: 3.40  cm   +-------------+-------++  Ao Asc diam:  3.10 cm   +-------------+-------++  +--------------+----------++ +---------------+-----------++  MITRAL VALVE       TRICUSPID VALVE     +--------------+----------++ +---------------+-----------++  MV Area (PHT): 5.13 cm     TR Peak grad:  24.8 mmHg    +--------------+----------++ +---------------+-----------++  MV PHT:  42.92 msec    TR Vmax:  249.00 cm/s   +--------------+----------++ +---------------+-----------++  MV Decel Time: 148 msec    +--------------+----------++ +--------------+-------+ +--------------+-----------++  SHUNTS      MV E velocity: 135.00 cm/s   +--------------+-------+ +--------------+-----------++  Systemic VTI:  0.10 m    MV A velocity: 40.20 cm/s    +--------------+-------+ +--------------+-----------++  Systemic Diam: 2.20 cm   MV E/A ratio:  3.36    +--------------+-------+ +--------------+-----------++   Charlton Haws MD Electronically signed by Charlton Haws MD Signature Date/Time: 01/10/2019/1:52:42 PM    ADDENDUM REPORT: 01/10/2019 17:30  CLINICAL DATA: Aortic stenosis  EXAM: Cardiac TAVR CT  TECHNIQUE: The patient was scanned on a Siemens Force 192 slice scanner. A 120 kV retrospective scan was triggered in the descending thoracic aorta at 111 HU's. Gantry rotation speed was 270 msecs and collimation was .9 mm. No beta blockade or nitro were given. The 3D data set was reconstructed in 5% intervals of the R-R cycle. Systolic and diastolic phases were analyzed on a dedicated work station using MPR, MIP and VRT modes. The patient received 80 cc of contrast.  FINDINGS: Aortic Valve: Tri leaflet and calcified with restricted leaflet motion RCC particularly calcified  Aorta: Moderate calcific atherosclerosis with no aneurysm  Sinotubular Junction: 26 mm There is dense calcification of the STJ especially near the RCA ostium  where it is bulky and nodular  Ascending Thoracic Aorta: 32 mm  Aortic Arch: 31 mm  Descending Thoracic Aorta: 26 mm  Sinus of [place] Measurements:  Non-coronary: 30.3 mm  Right - coronary: 28.7 mm  Left - coronary: 31.8 mm  Coronary Artery Height above Annulus:  Left Main: 16.4 mm above annulus  Right Coronary: 15.6 mm above annulus  Virtual Basal Annulus Measurements:  Maximum/Minimum Diameter: 32.5 mm x 22.7 mm  Perimeter: 91 mm  Area: 614 mm  Coronary Arteries: Sufficient height above annulus for deployment but native LM and RCA occluded. Occluded SVG to RCA. Patent LIMA to diagonal, Patent SVG to OM1 and patent SVG to mid LAD  Optimum Fluoroscopic Angle for Delivery: LAO 8 Caudal 1 degree  IMPRESSION: 1. Calcified tri leaflet AV with annular area of 614 mm2 This is suitable for a 29 mm Sapien 3 valve However the dense nodular STJ calcium and relatively small sinuses may make deployment more difficult  2. Coronary arteries sufficient height above annulus for deployment but native RCA/LM occluded Patent SVG to OM1, SVG to mid LAD Patent LIMA to D1 The native RCA and SVG to RCA are occluded  3. Optimum angiographic angle for deployment LAO 8 degrees Caudal 1 degree  4. Cannot r/o thrombus in LAA no delayed images performed  5. Degraded images with pacing wire artifact and motion  artifact but diagnostic for annular area  Charlton Haws   Electronically Signed By: Charlton Haws M.D. On: 01/10/2019 17:30   Addended by Wendall Stade, MD on 01/10/2019 5:33 PM    Study Result   EXAM: OVER-READ INTERPRETATION CT CHEST  The following report is an over-read performed by radiologist Dr. Cleone Slim of [facility] on 01/10/2019. This over-read does not include interpretation of cardiac or coronary anatomy or pathology. The cardiac CTA interpretation by the cardiologist is attached.  COMPARISON:  None.  FINDINGS: Please see the separate concurrent chest CT angiogram report for details.  IMPRESSION: Please see the separate concurrent chest CT angiogram report for details.  Electronically Signed: By: Delbert Phenix M.D. On: 01/10/2019 12:54       CLINICAL DATA: Severe symptomatic aortic stenosis with dyspnea on exertion. TAVR evaluation.  EXAM: CT ANGIOGRAPHY CHEST, ABDOMEN AND PELVIS  TECHNIQUE: Multidetector CT imaging through the chest, abdomen and pelvis was performed using the standard protocol during bolus administration of intravenous contrast. Multiplanar reconstructed images and MIPs were obtained and reviewed to evaluate the vascular anatomy.  CONTRAST: OMNIPAQUE IOHEXOL 350 MG/ML SOLN  COMPARISON: None.  FINDINGS: CTA CHEST FINDINGS  Cardiovascular: Moderate cardiomegaly. No significant pericardial effusion/thickening. Three-vessel coronary atherosclerosis status post CABG. Thickened and coarsely calcified aortic valve. Two lead right subclavian ICD is noted with lead tips in the right atrium and right ventricular apex. Atherosclerotic nonaneurysmal thoracic aorta. Normal caliber pulmonary arteries. No central pulmonary emboli.  Mediastinum/Nodes: Hypodense bilateral thyroid lobe nodules, largest 0.2 cm on the right. Unremarkable esophagus. No pathologically enlarged axillary, mediastinal or hilar lymph nodes.  Lungs/Pleura: No pneumothorax. Trace dependent bilateral pleural effusions. Mild interlobular septal thickening throughout both lungs. Mild patchy ground-glass opacity at the peripheral lung bases, left greater than right. No acute consolidative airspace disease or lung masses. Few scattered small solid pulmonary nodules in both lungs, largest 4 mm in the posterior left upper lobe (series 16/image 3).  Musculoskeletal: No aggressive appearing focal osseous lesions. Intact sternotomy wires. Moderate thoracic  spondylosis.  CTA ABDOMEN AND PELVIS FINDINGS  Hepatobiliary: Finely irregular liver surface, cannot exclude cirrhosis. No liver masses. Cholecystectomy. No biliary ductal dilatation.  Pancreas: Normal, with no mass or duct dilation.  Spleen: Normal size. No mass.  Adrenals/Urinary Tract: Normal adrenals. Bilateral renal cortical scarring, moderate to severe on the left and mild on the right. No renal masses. No hydronephrosis. Normal bladder.  Stomach/Bowel: Normal non-distended stomach. Normal caliber small bowel with no small bowel wall thickening. Normal appendix. Mild diffuse colonic diverticulosis, with no large bowel wall thickening or significant pericolonic fat stranding.  Vascular/Lymphatic: Atherosclerotic abdominal aorta with ectatic 2.5 cm infrarenal abdominal aorta. No pathologically enlarged lymph nodes in the abdomen or pelvis.  Reproductive: Mildly enlarged prostate.  Other: No pneumoperitoneum. No focal fluid collection. Small volume and trace perihepatic ascites.  Musculoskeletal: No aggressive appearing focal osseous lesions. Marked lumbar spondylosis. Ankylosis at L2-3.  VASCULAR MEASUREMENTS PERTINENT TO TAVR:  AORTA:  Minimal Aortic Diameter-12.0 x 10.0 mm  Severity of Aortic Calcification-severe  RIGHT PELVIS:  Right Common Iliac Artery -  Minimal Diameter-7.1 x 5.2 mm  Tortuosity-mild  Calcification-severe  Right External Iliac Artery -  Minimal Diameter-6.1 x 6.0 mm  Tortuosity-mild-to-moderate  Calcification-mild  Right Common Femoral Artery -  Minimal Diameter-4.8 x 4.1 mm  Tortuosity-mild  Calcification-severe  LEFT PELVIS:  Left Common Iliac Artery -  Minimal Diameter-8.4 x 4.6 mm  Tortuosity-mild  Calcification-severe  Left External Iliac Artery -  Minimal Diameter-7.9 x 7.9 mm  Tortuosity-mild  Calcification-mild  Left Common Femoral Artery -  Minimal  Diameter-6.2 x 5.9 mm  Tortuosity-mild  Calcification-severe  Review of the MIP images confirms the above findings.  IMPRESSION: 1. Vascular findings and measurements pertinent to potential TAVR procedure, as detailed. 2. Severe thickening and calcification of the aortic valve, compatible with the provided history of severe symptomatic aortic stenosis. 3. Ectatic 2.5 cm infrarenal abdominal aorta, at risk for aneurysm development. Recommend follow-up aortic ultrasound in 5 years. This recommendation follows ACR consensus guidelines: White Paper of the ACR Incidental Findings Committee II on Vascular Findings. J Am Coll Radiol 2013; 16:109-604. 4. Aortic Atherosclerosis (ICD10-I70.0). 5. Moderate cardiomegaly. Mild interlobular septal thickening and mild patchy ground-glass opacity in both lungs, favor mild pulmonary edema. Trace dependent bilateral pleural effusions. 6. Scattered small solid pulmonary nodules in both lungs, largest 4 mm. No follow-up needed if patient is low-risk (and has no known or suspected primary neoplasm). Non-contrast chest CT can be considered in 12 months if patient is high-risk. This recommendation follows the consensus statement: Guidelines for Management of Incidental Pulmonary Nodules Detected on CT Images: From the Fleischner Society 2017; Radiology 2017; 284:228-243. 7. Finely irregular liver surface, cannot exclude hepatic cirrhosis. Consider hepatic elastography for further liver fibrosis risk stratification, as clinically warranted. 8. Trace perihepatic and small volume pelvic ascites. 9. Mildly enlarged prostate. 10. Mild colonic diverticulosis.   Electronically Signed By: Delbert Phenix M.D. On: 01/10/2019 13:36   STS Risk score: Risk of Mortality: 5.407% Renal Failure: 6.472% Permanent Stroke: 1.923% Prolonged Ventilation: 16.937% DSW Infection: 0.263% Reoperation: 6.148% Morbidity or Mortality: 23.504% Short Length of  Stay: 16.129% Long Length of Stay: 12.995%   Impression:  This 84 year old gentleman has stage D, severe, symptomatic aortic stenosis with NYHA class III symptoms of exertional fatigue and shortness of breath with an LVEF of 25% consistent with chronic combined systolic and diastolic congestive heart failure. I have personally reviewed his echo, cath and CTA studies. His most recent echo showed a calcified aortic valve with poor leaflet mobility that looks like a severely stenotic valve although the mean gradient was only 20 mm Hg. His EF has decreased to 25% from 35-45% 6 months ago and I think he has low gradient, low flow, severe AS. His cath showed 3/4 patent grafts and a mean AV gradient of 9 mm Hg with a peak to peak of 14 mm Hg and severe pulmonary hypertension at that time. He only had mild MR by recent echo. I think TAVR is the best treatment to try to improve his congestive heart failure symptoms and prevent further LV deterioration. His gated cardiac CTA shows anatomy suitable for TAVR using a 29 mm Sapien 3 valve. There is some calcification of the sinotubular junction but it is not circumferential. His abdominal and pelvic CTA shows severe aortailiac calcific atherosclerosis and the right common iliac has circumferential calcification with borderline diameter. The left common iliac has borderline diameter but the calcification is not circumferential so it may be possible to perform a transfemoral insertion on the left side. The left subclavian artery has an adequate diameter with some focal calcification at the origin but I think it is probably adequate. The left carotid artery is too small. Transapical insertion is not possible since there is an epicardial pacing lead at this location.   The patient and his wife were counseled at length regarding treatment alternatives for management of severe symptomatic aortic stenosis. The risks  and benefits of surgical intervention has been discussed in  detail. Long-term prognosis with medical therapy was discussed. Alternative approaches such as conventional surgical aortic valve replacement, transcatheter aortic valve replacement, and palliative medical therapy were compared and contrasted at length. This discussion was placed in the context of the patient's own specific clinical presentation and past medical history. All of their questions have been addressed.  Following the decision to proceed with transcatheter aortic valve replacement, a discussion was held regarding what types of management strategies would be attempted intraoperatively in the event of life-threatening complications, including whether or not the patient would be considered a candidate for the use of cardiopulmonary bypass and/or conversion to open sternotomy for attempted surgical intervention. The patient is aware of the fact that transient use of cardiopulmonary bypass may be necessary. With previous CABG and severe LV systolic dysfunction at 84 years old I would not consider him a candidate for sternotomy to address any intraop complications and he and his wife understand that.   The patient has been advised of a variety of complications that might develop including but not limited to risks of death, stroke, paravalvular leak, aortic dissection or other major vascular complications, aortic annulus rupture, device embolization, cardiac rupture or perforation, mitral regurgitation, acute myocardial infarction, arrhythmia, heart block or bradycardia requiring permanent pacemaker placement, congestive heart failure, respiratory failure, renal failure, pneumonia, infection, other late complications related to structural valve deterioration or migration, or other complications that might ultimately cause a temporary or permanent loss of functional independence or other long term morbidity. The patient provides full informed consent for the procedure as described and all questions were  answered.     Plan:  Left transfemoral or left subclavian TAVR on Tuesday 01/22/2019.   [name], MD

## 2019-01-21 NOTE — Progress Notes (Signed)
Anesthesia Chart Review:  Case:  [id number] Date/Time:  01/22/19 1203   Procedures:      TRANSCATHETER AORTIC VALVE REPLACEMENT, TRANSFEMORAL (N/A Chest)     TRANSCATHETER AORTIC VALVE REPLACEMENT, POSSIBLE LEFT SUBCLAVIAN (N/A Chest)     TRANSESOPHAGEAL ECHOCARDIOGRAM (TEE) (N/A )   Anesthesia type:  General   Pre-op diagnosis:  Severe Aortic Stenosis   Location:  [facility] ROOM 16 / [facility] OR   Surgeon:  Kathleene HazelMcAlhany, Christopher D, MD    CT surgeon: Evelene CroonBartle, Bryan, MD  DISCUSSION: Patient is an 84 year old male scheduled the above procedure.  History includes former smoker (quit 1985), post-operative N/V, aortic stenosis, CAD (NSTEMI; s/p CABG LIMA-LAD, SVG-Ramus, SVG-DIAG, SVG-DIAG 1994; DES OM1 11/2012; DES SVG-Ramus 10/12/16; SVG-RCA chronic occlusion 11/23/18), ischemic cardiomyopathy, chronic systolic CHF, PAF (s/p ablation), LAA thrombus (02/2015; cannot rule out LAA thrombus coronary CT 01/09/09, no mention of LAA thrombus on 01/10/19 TTE), NSVT (s/p ICD, s/p explantation 02/27/15 due to enterococcal bacteremia; s/p new Medtronic ICD 03/12/15), HTN, HLD, syncope, exertional dyspnea, GERD, glaucoma, hepatitis C (remote history) with liver disease.  He has a Medtronic ICD (VIVA XT CRT-D [id number]). According to 10/07/18 interrogation [facility] Care Everywhere), AF burden 100%. Bi-V pacing > 98.0% of the time, battery longevity of 15 months.   COVID test negative on 01/18/19. He will need repeat ABG on the day of surgery as PAT specimen was venous. Reviewed his elevated PT/INR of 18.8/1.6 and total bilirubin of 2.6 with Carlean Jewshompson, Katie, PA-C. Warfarin on hold since 01/16/19, so will not need to be repeated. In the absence of acute abdominal symptoms or significantly elevated AST/ALT, she felt patient could follow-up with his PCP following recovery from TAVR. History does list hepatitis C and liver disease, although total bilirubin 1.4 on 01/08/19 [facility] Care Everywhere). If no acute changes then I anticipate that he can proceed  as planned.   VS: BP 131/82   Pulse (!) 55   Temp 36.6 C (Oral)   Resp 20   Ht 5\' 6"  (1.676 m)   Wt 82.4 kg   SpO2 100%   BMI 29.33 kg/m     PROVIDERS: Day, Pernell Dupreemple V, MD is PCP ([facility]) Verne CarrowMcAlhany, Christopher, MD is cardiologist. First established 11/05/18. [facility] (but never had procedure) and had been followed by Newt LukesApplegate, Robert, [facility] and also by cardiology Andee Poles(Humphrey, Jeanice LimHolly, MD) [facility].    LABS: Preoperative labs noted and also reviewed by Carlean Jewshompson, Katie, PA-C with TAVR team. (all labs ordered are listed, but only abnormal results are displayed)  Labs Reviewed  BRAIN NATRIURETIC PEPTIDE - Abnormal; Notable for the following components:      Result Value   B Natriuretic Peptide 682.2 (*)    All other components within normal limits  CBC - Abnormal; Notable for the following components:   RBC 4.10 (*)    MCV 109.5 (*)    MCH 35.6 (*)    Platelets 139 (*)    All other components within normal limits  COMPREHENSIVE METABOLIC PANEL - Abnormal; Notable for the following components:   CO2 19 (*)    Creatinine, Ser 1.37 (*)    Total Protein 6.2 (*)    AST 62 (*)    Total Bilirubin 2.6 (*)    GFR calc non Af Amer 48 (*)    GFR calc Af Amer 56 (*)    All other components within normal limits  PROTIME-INR -  Abnormal; Notable for the following components:   Prothrombin Time 18.8 (*)    INR 1.6 (*)    All other components within normal limits  URINALYSIS, ROUTINE W REFLEX MICROSCOPIC - Abnormal; Notable for the following components:   Color, Urine AMBER (*)    All other components within normal limits  SURGICAL PCR SCREEN  APTT  HEMOGLOBIN A1C  BLOOD GAS, ARTERIAL  TYPE AND SCREEN  ABO/RH    IMAGES: CXR 01/18/19: IMPRESSION: Cardiomegaly status post median sternotomy and CABG. Right chest multi lead pacer defibrillator. Mild pulmonary hyperinflation and diaphragmatic flattening  without acute abnormality of the lungs.  CTA chest/abd/pelvis 01/10/19: IMPRESSION: 1. Vascular findings and measurements pertinent to potential TAVR procedure, as detailed. [See Full Report] 2. Severe thickening and calcification of the aortic valve, compatible with the provided history of severe symptomatic aortic stenosis. 3. Ectatic 2.5 cm infrarenal abdominal aorta, at risk for aneurysm development. Recommend follow-up aortic ultrasound in 5 years. This recommendation follows ACR consensus guidelines: White Paper of the ACR Incidental Findings Committee II on Vascular Findings. J Am Coll Radiol 2013; 27:253-664. 4.  Aortic Atherosclerosis (ICD10-I70.0). 5. Moderate cardiomegaly. Mild interlobular septal thickening and mild patchy ground-glass opacity in both lungs, favor mild pulmonary edema. Trace dependent bilateral pleural effusions. 6. Scattered small solid pulmonary nodules in both lungs, largest 4 mm. No follow-up needed if patient is low-risk (and has no known or suspected primary neoplasm). Non-contrast chest CT can be considered in 12 months if patient is high-risk. This recommendation follows the consensus statement: Guidelines for Management of Incidental Pulmonary Nodules Detected on CT Images: From the Fleischner Society 2017; Radiology 2017; 284:228-243. 7. Finely irregular liver surface, cannot exclude hepatic cirrhosis. Consider hepatic elastography for further liver fibrosis risk stratification, as clinically warranted. 8. Trace perihepatic and small volume pelvic ascites. 9. Mildly enlarged prostate. 10. Mild colonic diverticulosis.   EKG: 01/18/19: Ventricular-paced rhythm Background appears to be Atrial fibrillation No previous tracing   CV: Coronary CT 01/10/19: IMPRESSION: 1. Calcified tri leaflet AV with annular area of 614 mm2 This is suitable for a 29 mm Sapien 3 valve However the dense nodular STJ calcium and relatively small sinuses may make  deployment more difficult 2. Coronary arteries sufficient height above annulus for deployment but native RCA/LM occluded Patent SVG to OM1, SVG to mid LAD Patent LIMA to D1 The native RCA and SVG to RCA are occluded 3. Optimum angiographic angle for deployment LAO 8 degrees Caudal 1 degree 4.  Cannot r/o thrombus in LAA no delayed images performed 5. Degraded images with pacing wire artifact and motion artifact but diagnostic for annular area  Echo 01/10/19: IMPRESSIONS  1. The left ventricle has a visually estimated ejection fraction of 25%. The cavity size was moderately dilated. Left ventricular diastolic Doppler parameters are indeterminate.  2. Diffuse hypokinesis worse in the inferior wall and septum.  3. The right ventricle has normal systolic function. The cavity was normal. There is no increase in right ventricular wall thickness.  4. Left atrial size was severely dilated.  5. PFO small present.  6. Right atrial size was moderately dilated.  7. Mild thickening of the mitral valve leaflet. Mild calcification of the mitral valve leaflet.  8. The aortic valve is tricuspid. Moderate thickening of the aortic valve. Moderate calcification of the aortic valve. Aortic valve regurgitation is mild by color flow Doppler. Severe stenosis of the aortic valve.  9. Severe low gradient low flow AS. 10. PFO.  Cardiac cath 11/23/18:  Prox RCA lesion is 99% stenosed.  Prox RCA to Mid RCA lesion is 100% stenosed.  SVG graft to distal RCA was visualized by angiography. Chronic occlusion.   Origin lesion is 100% stenosed.  Mid LM to Dist LM lesion is 99% stenosed.  SVG graft to 1st Mrg was visualized by angiography and is normal in caliber.  Prox Graft lesion is 10% stenosed.  Mid Graft lesion is 30% stenosed.  SVG graft was visualized by angiography and is normal in caliber.  LIMA graft was visualized by angiography and is normal in caliber. 1. Severe triple vessel CAD s/p 4V CABG with  3/4 patent grafts 2. Severe low flow, low gradient aortic stenosis by echo (cath with mean gradient 8.6 mmHg, peak to peak gradient 14 mmHg, AVA 1.73 cm2, cardiac output 3.88 L/min, cardiac index 2.03) Recommendations: Continue workup for TAVR  Carotid US 01/10/19: Summary: Right Carotid: Velocities in the right ICA are consistent with a 1-39% stenosis. Left Carotid: Velocities in the left ICA are consistent with a 1-39% stenosis.   Past Medical History:  Diagnosis Date  . AICD (automatic cardioverter/defibrillator) present   . Aortic stenosis   . Arrhythmia    paroxysmal atrial fibrillation  . Arthritis   . Bacteremia   . Cardiac arrest (HCC)   . Cardiac LV ejection fraction 30-35%   . Cataract   . Compression of lumbar vertebra (HCC)   . Coronary artery disease   . Dyspnea   . Dyspnea on exertion   . ED (erectile dysfunction)   . Elevated prostate specific antigen (PSA)   . GERD (gastroesophageal reflux disease)   . Glaucoma primary, open angle    BOTH EYES  . H/O ventricular tachycardia   . Hepatitis C virus infection   . History of discitis   . Hyperlipidemia   . Hypertension   . Hypertrophy of prostate with urinary obstruction    AND LOWER URINARY TRACT SYMPTOMS  . Hypokinesis   . Liver disease   . Macular hole of left eye   . Non-ST elevation MI (NSTEMI) (HCC)   . PONV (postoperative nausea and vomiting)   . Pre-diabetes   . Pseudophakia of left eye   . Retinal detachment   . Syncope   . Systolic CHF, chronic (HCC)   . Thrombus    laa    Past Surgical History:  Procedure Laterality Date  . AV NODE ABLATION    . CARDIAC CATHETERIZATION    . CATARACT EXTRACTION    . CHOLECYSTECTOMY    . CORONARY ANGIOPLASTY WITH STENT PLACEMENT    . CORONARY ARTERY BYPASS GRAFT    . INSERT / REPLACE / REMOVE PACEMAKER    . RIGHT/LEFT HEART CATH AND CORONARY/GRAFT ANGIOGRAPHY N/A 11/23/2018   Procedure: RIGHT/LEFT HEART CATH AND CORONARY/GRAFT ANGIOGRAPHY;  Surgeon:  Kathleene Hazel, MD;  Location: [facility] INVASIVE CV LAB;  Service: Cardiovascular;  Laterality: N/A;  . SKIN BIOPSY    . YAG LASER APPLICATION      MEDICATIONS: . aspirin EC 81 MG tablet  . brimonidine (ALPHAGAN) 0.2 % ophthalmic solution  . carvedilol (COREG) 12.5 MG tablet  . Cholecalciferol (VITAMIN D) 50 MCG (2000 UT) CAPS  . dorzolamide (TRUSOPT) 2 % ophthalmic solution  . famotidine (PEPCID) 20 MG tablet  . furosemide (LASIX) 40 MG tablet  . latanoprost (XALATAN) 0.005 % ophthalmic solution  . lisinopril (PRINIVIL,ZESTRIL) 40 MG tablet  . magnesium oxide (MAG-OX) 400 MG tablet  . Multiple Vitamin (MULTIVITAMIN) tablet  .  nitroGLYCERIN (NITROSTAT) 0.4 MG SL tablet  . pravastatin (PRAVACHOL) 80 MG tablet  . ranolazine (RANEXA) 500 MG 12 hr tablet  . warfarin (COUMADIN) 5 MG tablet   No current facility-administered medications for this encounter.     Shonna Chock, PA-C Surgical Short Stay/Anesthesiology [facility] Phone [phone] [facility] Phone [phone] 01/21/2019 12:47 PM

## 2019-01-21 NOTE — Anesthesia Preprocedure Evaluation (Addendum)
Anesthesia Evaluation  Patient identified by MRN, date of birth, ID band Patient awake    Reviewed: Allergy & Precautions, NPO status , Patient's Chart, lab work & pertinent test results  History of Anesthesia Complications (+) PONV  Airway Mallampati: III  TM Distance: >3 FB Neck ROM: Full    Dental  (+) Partial Lower   Pulmonary former smoker,    breath sounds clear to auscultation       Cardiovascular hypertension, Pt. on home beta blockers and Pt. on medications + CAD, + Past MI, + CABG and +CHF  + Cardiac Defibrillator + Valvular Problems/Murmurs AS  Rhythm:Regular Rate:Normal + Systolic murmurs    Neuro/Psych negative neurological ROS  negative psych ROS   GI/Hepatic GERD  Medicated,(+) Hepatitis -  Endo/Other  negative endocrine ROS  Renal/GU negative Renal ROS     Musculoskeletal  (+) Arthritis ,   Abdominal Normal abdominal exam  (+)   Peds  Hematology negative hematology ROS (+)   Anesthesia Other Findings   Reproductive/Obstetrics                            Lab Results  Component Value Date   WBC 7.0 01/18/2019   HGB 14.6 01/18/2019   HCT 44.9 01/18/2019   MCV 109.5 (H) 01/18/2019   PLT 139 (L) 01/18/2019   Lab Results  Component Value Date   CREATININE 1.37 (H) 01/18/2019   BUN 21 01/18/2019   NA 138 01/18/2019   K 4.6 01/18/2019   CL 106 01/18/2019   CO2 19 (L) 01/18/2019   Lab Results  Component Value Date   INR 1.6 (H) 01/18/2019   INR 1.1 11/23/2018   COVID-19 Labs  No results for input(s): DDIMER, FERRITIN, LDH, CRP in the last 72 hours.  Lab Results  Component Value Date   SARSCOV2NAA NOT DETECTED 01/18/2019   EKG: V paced.  Echo: 1. The left ventricle has a visually estimated ejection fraction of 25%. The cavity size was moderately dilated. Left ventricular diastolic Doppler parameters are indeterminate.  2. Diffuse hypokinesis worse in the  inferior wall and septum.  3. The right ventricle has normal systolic function. The cavity was normal. There is no increase in right ventricular wall thickness.  4. Left atrial size was severely dilated.  5. PFO small present.  6. Right atrial size was moderately dilated.  7. Mild thickening of the mitral valve leaflet. Mild calcification of the mitral valve leaflet.  8. The aortic valve is tricuspid. Moderate thickening of the aortic valve. Moderate calcification of the aortic valve. Aortic valve regurgitation is mild by color flow Doppler. Severe stenosis of the aortic valve.  9. Severe low gradient low flow AS. 10. PFO.  Anesthesia Physical Anesthesia Plan  ASA: IV  Anesthesia Plan: General   Post-op Pain Management:    Induction: Intravenous  PONV Risk Score and Plan: 4 or greater and Ondansetron, Dexamethasone and Treatment may vary due to age or medical condition  Airway Management Planned: Oral ETT  Additional Equipment: Arterial line  Intra-op Plan:   Post-operative Plan: Possible Post-op intubation/ventilation  Informed Consent: I have reviewed the patients History and Physical, chart, labs and discussed the procedure including the risks, benefits and alternatives for the proposed anesthesia with the patient or authorized representative who has indicated his/her understanding and acceptance.     Dental advisory given  Plan Discussed with: CRNA  Anesthesia Plan Comments: (PAT note written 01/21/2019 by Revonda Standard  Zelenak, PA-C. Has [employer] ICD. )       Anesthesia Quick Evaluation

## 2019-01-22 ENCOUNTER — Other Ambulatory Visit: Payer: Self-pay

## 2019-01-22 ENCOUNTER — Encounter (HOSPITAL_COMMUNITY): Payer: Self-pay | Admitting: Certified Registered Nurse Anesthetist

## 2019-01-22 ENCOUNTER — Inpatient Hospital Stay (HOSPITAL_COMMUNITY): Payer: No Typology Code available for payment source

## 2019-01-22 ENCOUNTER — Inpatient Hospital Stay (HOSPITAL_COMMUNITY): Payer: No Typology Code available for payment source | Admitting: Certified Registered Nurse Anesthetist

## 2019-01-22 ENCOUNTER — Encounter (HOSPITAL_COMMUNITY)
Admission: RE | Disposition: A | Discharge status: Home / Self Care | Payer: Self-pay | Source: Home / Self Care | Attending: Surgery

## 2019-01-22 ENCOUNTER — Ambulatory Visit (HOSPITAL_COMMUNITY): Payer: No Typology Code available for payment source

## 2019-01-22 ENCOUNTER — Inpatient Hospital Stay (HOSPITAL_COMMUNITY)
Admission: RE | Admit: 2019-01-22 | Discharge: 2019-01-23 | DRG: 266 | Disposition: A | Discharge status: Home / Self Care | Payer: No Typology Code available for payment source | Attending: Surgery | Admitting: Surgery

## 2019-01-22 ENCOUNTER — Inpatient Hospital Stay (HOSPITAL_COMMUNITY): Payer: No Typology Code available for payment source | Admitting: Vascular Surgery

## 2019-01-22 DIAGNOSIS — I110 Hypertensive heart disease with heart failure: Secondary | ICD-10-CM | POA: Diagnosis present

## 2019-01-22 DIAGNOSIS — E785 Hyperlipidemia, unspecified: Secondary | ICD-10-CM | POA: Diagnosis present

## 2019-01-22 DIAGNOSIS — I472 Ventricular tachycardia, unspecified: Secondary | ICD-10-CM

## 2019-01-22 DIAGNOSIS — I4891 Unspecified atrial fibrillation: Secondary | ICD-10-CM | POA: Diagnosis present

## 2019-01-22 DIAGNOSIS — Z7982 Long term (current) use of aspirin: Secondary | ICD-10-CM | POA: Diagnosis not present

## 2019-01-22 DIAGNOSIS — Z7901 Long term (current) use of anticoagulants: Secondary | ICD-10-CM

## 2019-01-22 DIAGNOSIS — I4729 Other ventricular tachycardia: Secondary | ICD-10-CM

## 2019-01-22 DIAGNOSIS — Z95810 Presence of automatic (implantable) cardiac defibrillator: Secondary | ICD-10-CM | POA: Diagnosis not present

## 2019-01-22 DIAGNOSIS — Z87891 Personal history of nicotine dependence: Secondary | ICD-10-CM

## 2019-01-22 DIAGNOSIS — Z884 Allergy status to anesthetic agent status: Secondary | ICD-10-CM | POA: Diagnosis not present

## 2019-01-22 DIAGNOSIS — Z951 Presence of aortocoronary bypass graft: Secondary | ICD-10-CM

## 2019-01-22 DIAGNOSIS — Q211 Atrial septal defect: Secondary | ICD-10-CM | POA: Diagnosis not present

## 2019-01-22 DIAGNOSIS — Z952 Presence of prosthetic heart valve: Secondary | ICD-10-CM

## 2019-01-22 DIAGNOSIS — Z79899 Other long term (current) drug therapy: Secondary | ICD-10-CM

## 2019-01-22 DIAGNOSIS — I480 Paroxysmal atrial fibrillation: Secondary | ICD-10-CM | POA: Diagnosis present

## 2019-01-22 DIAGNOSIS — I350 Nonrheumatic aortic (valve) stenosis: Secondary | ICD-10-CM | POA: Diagnosis present

## 2019-01-22 DIAGNOSIS — Z006 Encounter for examination for normal comparison and control in clinical research program: Secondary | ICD-10-CM

## 2019-01-22 DIAGNOSIS — Z8674 Personal history of sudden cardiac arrest: Secondary | ICD-10-CM

## 2019-01-22 DIAGNOSIS — Z955 Presence of coronary angioplasty implant and graft: Secondary | ICD-10-CM

## 2019-01-22 DIAGNOSIS — I10 Essential (primary) hypertension: Secondary | ICD-10-CM | POA: Diagnosis present

## 2019-01-22 DIAGNOSIS — I2510 Atherosclerotic heart disease of native coronary artery without angina pectoris: Secondary | ICD-10-CM | POA: Diagnosis present

## 2019-01-22 DIAGNOSIS — I252 Old myocardial infarction: Secondary | ICD-10-CM

## 2019-01-22 DIAGNOSIS — K219 Gastro-esophageal reflux disease without esophagitis: Secondary | ICD-10-CM | POA: Diagnosis present

## 2019-01-22 DIAGNOSIS — I5043 Acute on chronic combined systolic (congestive) and diastolic (congestive) heart failure: Secondary | ICD-10-CM | POA: Diagnosis present

## 2019-01-22 DIAGNOSIS — Z9889 Other specified postprocedural states: Secondary | ICD-10-CM

## 2019-01-22 DIAGNOSIS — I255 Ischemic cardiomyopathy: Secondary | ICD-10-CM | POA: Diagnosis present

## 2019-01-22 DIAGNOSIS — B182 Chronic viral hepatitis C: Secondary | ICD-10-CM | POA: Diagnosis present

## 2019-01-22 DIAGNOSIS — Z954 Presence of other heart-valve replacement: Secondary | ICD-10-CM | POA: Diagnosis not present

## 2019-01-22 HISTORY — PX: TEE WITHOUT CARDIOVERSION: SHX5443

## 2019-01-22 HISTORY — DX: Benign prostatic hyperplasia without lower urinary tract symptoms: N40.0

## 2019-01-22 HISTORY — DX: Presence of prosthetic heart valve: Z95.2

## 2019-01-22 HISTORY — DX: Nonrheumatic aortic (valve) stenosis: I35.0

## 2019-01-22 HISTORY — PX: TRANSCATHETER AORTIC VALVE REPLACEMENT, TRANSFEMORAL: SHX6400

## 2019-01-22 HISTORY — DX: Ischemic cardiomyopathy: I25.5

## 2019-01-22 HISTORY — DX: Chronic atrial fibrillation, unspecified: I48.20

## 2019-01-22 LAB — POCT I-STAT 7, (LYTES, BLD GAS, ICA,H+H)
Acid-base deficit: 3 mmol/L — ABNORMAL HIGH (ref 0.0–2.0)
Bicarbonate: 23.4 mmol/L (ref 20.0–28.0)
Calcium, Ion: 1.21 mmol/L (ref 1.15–1.40)
HCT: 38 % — ABNORMAL LOW (ref 39.0–52.0)
Hemoglobin: 12.9 g/dL — ABNORMAL LOW (ref 13.0–17.0)
O2 Saturation: 99 %
Potassium: 3.9 mmol/L (ref 3.5–5.1)
Sodium: 141 mmol/L (ref 135–145)
TCO2: 25 mmol/L (ref 22–32)
pCO2 arterial: 44 mmHg (ref 32.0–48.0)
pH, Arterial: 7.334 — ABNORMAL LOW (ref 7.350–7.450)
pO2, Arterial: 141 mmHg — ABNORMAL HIGH (ref 83.0–108.0)

## 2019-01-22 LAB — BLOOD GAS, ARTERIAL
Acid-base deficit: 0 mmol/L (ref 0.0–2.0)
Bicarbonate: 23.9 mmol/L (ref 20.0–28.0)
FIO2: 21
O2 Saturation: 96.5 %
Patient temperature: 98.6
pCO2 arterial: 37.8 mmHg (ref 32.0–48.0)
pH, Arterial: 7.418 (ref 7.350–7.450)
pO2, Arterial: 90.6 mmHg (ref 83.0–108.0)

## 2019-01-22 LAB — POCT I-STAT 4, (NA,K, GLUC, HGB,HCT)
Glucose, Bld: 120 mg/dL — ABNORMAL HIGH (ref 70–99)
Glucose, Bld: 110 mg/dL — ABNORMAL HIGH (ref 70–99)
Glucose, Bld: 124 mg/dL — ABNORMAL HIGH (ref 70–99)
HCT: 39 % (ref 39.0–52.0)
HCT: 34 % — ABNORMAL LOW (ref 39.0–52.0)
HCT: 35 % — ABNORMAL LOW (ref 39.0–52.0)
Hemoglobin: 13.3 g/dL (ref 13.0–17.0)
Hemoglobin: 11.6 g/dL — ABNORMAL LOW (ref 13.0–17.0)
Hemoglobin: 11.9 g/dL — ABNORMAL LOW (ref 13.0–17.0)
Potassium: 4.1 mmol/L (ref 3.5–5.1)
Potassium: 3.9 mmol/L (ref 3.5–5.1)
Potassium: 4 mmol/L (ref 3.5–5.1)
Sodium: 140 mmol/L (ref 135–145)
Sodium: 141 mmol/L (ref 135–145)
Sodium: 142 mmol/L (ref 135–145)

## 2019-01-22 LAB — POCT I-STAT CREATININE: Creatinine, Ser: 1.2 mg/dL (ref 0.61–1.24)

## 2019-01-22 SURGERY — IMPLANTATION, AORTIC VALVE, TRANSCATHETER, FEMORAL APPROACH
Anesthesia: General | Site: Chest

## 2019-01-22 MED ORDER — ETOMIDATE 2 MG/ML IV SOLN
INTRAVENOUS | Status: DC | PRN
  Administered 2019-01-22: 14 mg via INTRAVENOUS

## 2019-01-22 MED ORDER — SUCCINYLCHOLINE CHLORIDE 20 MG/ML IJ SOLN
INTRAMUSCULAR | Status: DC | PRN
  Administered 2019-01-22: 120 mg via INTRAVENOUS

## 2019-01-22 MED ORDER — SODIUM CHLORIDE 0.9 % IV SOLN
INTRAVENOUS | Status: AC
  Administered 2019-01-22: 18:00:00 via INTRAVENOUS

## 2019-01-22 MED ORDER — SODIUM CHLORIDE 0.9 % IV SOLN
INTRAVENOUS | Status: AC
  Filled 2019-01-22 (×3): qty 1.2

## 2019-01-22 MED ORDER — IODIXANOL 320 MG/ML IV SOLN
INTRAVENOUS | Status: DC | PRN
  Administered 2019-01-22: 40.3 mL via INTRAVENOUS

## 2019-01-22 MED ORDER — ONDANSETRON HCL 4 MG/2ML IJ SOLN
INTRAMUSCULAR | Status: DC | PRN
  Administered 2019-01-22: 4 mg via INTRAVENOUS

## 2019-01-22 MED ORDER — HEPARIN SODIUM (PORCINE) 1000 UNIT/ML IJ SOLN
INTRAMUSCULAR | Status: AC
  Filled 2019-01-22: qty 1

## 2019-01-22 MED ORDER — NITROGLYCERIN IN D5W 200-5 MCG/ML-% IV SOLN: 0.0000 ug/min | INTRAVENOUS | Status: DC

## 2019-01-22 MED ORDER — LATANOPROST 0.005 % OP SOLN
1.0000 [drp] | Freq: Every day | OPHTHALMIC | Status: DC
  Administered 2019-01-22: 22:00:00 1 [drp] via OPHTHALMIC
  Filled 2019-01-22: qty 2.5

## 2019-01-22 MED ORDER — LIDOCAINE 2% (20 MG/ML) 5 ML SYRINGE
INTRAMUSCULAR | Status: DC | PRN
  Administered 2019-01-22: 60 mg via INTRAVENOUS

## 2019-01-22 MED ORDER — SODIUM CHLORIDE 0.9 % IV SOLN: INTRAVENOUS | Status: DC

## 2019-01-22 MED ORDER — HEPARIN SODIUM (PORCINE) 1000 UNIT/ML IJ SOLN
INTRAMUSCULAR | Status: DC | PRN
  Administered 2019-01-22: 14000 [IU] via INTRAVENOUS

## 2019-01-22 MED ORDER — SUGAMMADEX SODIUM 200 MG/2ML IV SOLN
INTRAVENOUS | Status: DC | PRN
  Administered 2019-01-22: 120 mg via INTRAVENOUS

## 2019-01-22 MED ORDER — SODIUM CHLORIDE 0.9 % IV SOLN
INTRAVENOUS | Status: DC | PRN
  Administered 2019-01-22: 1500 mL

## 2019-01-22 MED ORDER — SODIUM CHLORIDE 0.9 % IV SOLN
1.5000 g | Freq: Two times a day (BID) | INTRAVENOUS | Status: DC
  Administered 2019-01-22 – 2019-01-23 (×2): 1.5 g via INTRAVENOUS
  Filled 2019-01-22 (×3): qty 1.5

## 2019-01-22 MED ORDER — PHENYLEPHRINE HCL-NACL 20-0.9 MG/250ML-% IV SOLN
0.0000 ug/min | INTRAVENOUS | Status: DC
  Filled 2019-01-22: qty 250

## 2019-01-22 MED ORDER — EPHEDRINE 5 MG/ML INJ
INTRAVENOUS | Status: AC
  Filled 2019-01-22: qty 10

## 2019-01-22 MED ORDER — SODIUM CHLORIDE 0.9% FLUSH
3.0000 mL | Freq: Two times a day (BID) | INTRAVENOUS | Status: DC
  Administered 2019-01-22 – 2019-01-23 (×2): 3 mL via INTRAVENOUS

## 2019-01-22 MED ORDER — 0.9 % SODIUM CHLORIDE (POUR BTL) OPTIME
TOPICAL | Status: DC | PRN
  Administered 2019-01-22: 1000 mL

## 2019-01-22 MED ORDER — OXYCODONE HCL 5 MG PO TABS: 5.0000 mg | ORAL_TABLET | ORAL | Status: DC | PRN

## 2019-01-22 MED ORDER — PHENYLEPHRINE 40 MCG/ML (10ML) SYRINGE FOR IV PUSH (FOR BLOOD PRESSURE SUPPORT)
PREFILLED_SYRINGE | INTRAVENOUS | Status: AC
  Filled 2019-01-22: qty 10

## 2019-01-22 MED ORDER — MAGNESIUM OXIDE 400 (241.3 MG) MG PO TABS
400.0000 mg | ORAL_TABLET | ORAL | Status: DC
  Administered 2019-01-23: 09:00:00 400 mg via ORAL
  Filled 2019-01-22: qty 1

## 2019-01-22 MED ORDER — PRAVASTATIN SODIUM 40 MG PO TABS
40.0000 mg | ORAL_TABLET | Freq: Every day | ORAL | Status: DC
  Administered 2019-01-22: 40 mg via ORAL
  Filled 2019-01-22: qty 1

## 2019-01-22 MED ORDER — ACETAMINOPHEN 325 MG PO TABS: 650.0000 mg | ORAL_TABLET | Freq: Four times a day (QID) | ORAL | Status: DC | PRN

## 2019-01-22 MED ORDER — ONDANSETRON HCL 4 MG/2ML IJ SOLN
INTRAMUSCULAR | Status: AC
  Filled 2019-01-22: qty 2

## 2019-01-22 MED ORDER — ROCURONIUM BROMIDE 10 MG/ML (PF) SYRINGE
PREFILLED_SYRINGE | INTRAVENOUS | Status: AC
  Filled 2019-01-22: qty 10

## 2019-01-22 MED ORDER — FAMOTIDINE 20 MG PO TABS
20.0000 mg | ORAL_TABLET | Freq: Every day | ORAL | Status: DC
  Administered 2019-01-22: 20 mg via ORAL
  Filled 2019-01-22 (×2): qty 1

## 2019-01-22 MED ORDER — ASPIRIN EC 81 MG PO TBEC
81.0000 mg | DELAYED_RELEASE_TABLET | Freq: Every day | ORAL | Status: DC
  Administered 2019-01-22 – 2019-01-23 (×2): 81 mg via ORAL
  Filled 2019-01-22 (×2): qty 1

## 2019-01-22 MED ORDER — HYDRALAZINE HCL 20 MG/ML IJ SOLN
10.0000 mg | INTRAMUSCULAR | Status: DC | PRN
  Administered 2019-01-22: 16:00:00 10 mg via INTRAVENOUS

## 2019-01-22 MED ORDER — LACTATED RINGERS IV SOLN
INTRAVENOUS | Status: DC | PRN
  Administered 2019-01-22: 13:00:00 via INTRAVENOUS

## 2019-01-22 MED ORDER — DEXAMETHASONE SODIUM PHOSPHATE 10 MG/ML IJ SOLN
INTRAMUSCULAR | Status: AC
  Filled 2019-01-22: qty 1

## 2019-01-22 MED ORDER — MORPHINE SULFATE (PF) 2 MG/ML IV SOLN: 1.0000 mg | INTRAVENOUS | Status: DC | PRN

## 2019-01-22 MED ORDER — TRAMADOL HCL 50 MG PO TABS: 50.0000 mg | ORAL_TABLET | ORAL | Status: DC | PRN

## 2019-01-22 MED ORDER — SODIUM CHLORIDE 0.9 % IV SOLN: 250.0000 mL | INTRAVENOUS | Status: DC | PRN

## 2019-01-22 MED ORDER — NITROGLYCERIN IN D5W 200-5 MCG/ML-% IV SOLN
INTRAVENOUS | Status: AC
  Filled 2019-01-22: qty 250

## 2019-01-22 MED ORDER — SODIUM CHLORIDE 0.9 % IV SOLN
INTRAVENOUS | Status: DC | PRN
  Administered 2019-01-22: 10 ug/min via INTRAVENOUS

## 2019-01-22 MED ORDER — CHLORHEXIDINE GLUCONATE 4 % EX LIQD: 30.0000 mL | CUTANEOUS | Status: DC

## 2019-01-22 MED ORDER — CHLORHEXIDINE GLUCONATE 0.12 % MT SOLN
15.0000 mL | Freq: Once | OROMUCOSAL | Status: AC
  Administered 2019-01-22: 11:00:00 15 mL via OROMUCOSAL
  Filled 2019-01-22: qty 15

## 2019-01-22 MED ORDER — ACETAMINOPHEN 650 MG RE SUPP: 650.0000 mg | Freq: Four times a day (QID) | RECTAL | Status: DC | PRN

## 2019-01-22 MED ORDER — CHLORHEXIDINE GLUCONATE 4 % EX LIQD: 60.0000 mL | Freq: Once | CUTANEOUS | Status: DC

## 2019-01-22 MED ORDER — HYDRALAZINE HCL 20 MG/ML IJ SOLN
INTRAMUSCULAR | Status: AC
  Filled 2019-01-22: qty 1

## 2019-01-22 MED ORDER — LIDOCAINE HCL (PF) 1 % IJ SOLN
INTRAMUSCULAR | Status: AC
  Filled 2019-01-22: qty 30

## 2019-01-22 MED ORDER — DORZOLAMIDE HCL 2 % OP SOLN
1.0000 [drp] | Freq: Three times a day (TID) | OPHTHALMIC | Status: DC
  Administered 2019-01-22 – 2019-01-23 (×2): 1 [drp] via OPHTHALMIC
  Filled 2019-01-22: qty 10

## 2019-01-22 MED ORDER — DEXAMETHASONE SODIUM PHOSPHATE 10 MG/ML IJ SOLN
INTRAMUSCULAR | Status: DC | PRN
  Administered 2019-01-22: 5 mg via INTRAVENOUS

## 2019-01-22 MED ORDER — FENTANYL CITRATE (PF) 250 MCG/5ML IJ SOLN
INTRAMUSCULAR | Status: AC
  Filled 2019-01-22: qty 5

## 2019-01-22 MED ORDER — LIDOCAINE 2% (20 MG/ML) 5 ML SYRINGE
INTRAMUSCULAR | Status: AC
  Filled 2019-01-22: qty 5

## 2019-01-22 MED ORDER — LACTATED RINGERS IV SOLN
INTRAVENOUS | Status: DC | PRN
  Administered 2019-01-22: 11:00:00 via INTRAVENOUS

## 2019-01-22 MED ORDER — SODIUM CHLORIDE 0.9% FLUSH: 3.0000 mL | INTRAVENOUS | Status: DC | PRN

## 2019-01-22 MED ORDER — PROTAMINE SULFATE 10 MG/ML IV SOLN
INTRAVENOUS | Status: AC
  Filled 2019-01-22: qty 25

## 2019-01-22 MED ORDER — FENTANYL CITRATE (PF) 250 MCG/5ML IJ SOLN
INTRAMUSCULAR | Status: DC | PRN
  Administered 2019-01-22: 100 ug via INTRAVENOUS
  Administered 2019-01-22: 50 ug via INTRAVENOUS

## 2019-01-22 MED ORDER — BRIMONIDINE TARTRATE 0.2 % OP SOLN
1.0000 [drp] | Freq: Two times a day (BID) | OPHTHALMIC | Status: DC
  Administered 2019-01-22 – 2019-01-23 (×2): 1 [drp] via OPHTHALMIC
  Filled 2019-01-22: qty 5

## 2019-01-22 MED ORDER — VANCOMYCIN HCL IN DEXTROSE 1-5 GM/200ML-% IV SOLN
1000.0000 mg | Freq: Once | INTRAVENOUS | Status: AC
  Administered 2019-01-23: 1000 mg via INTRAVENOUS
  Filled 2019-01-22: qty 200

## 2019-01-22 MED ORDER — RANOLAZINE ER 500 MG PO TB12
500.0000 mg | ORAL_TABLET | Freq: Two times a day (BID) | ORAL | Status: DC
  Administered 2019-01-22 – 2019-01-23 (×2): 500 mg via ORAL
  Filled 2019-01-22 (×2): qty 1

## 2019-01-22 MED ORDER — ONDANSETRON HCL 4 MG/2ML IJ SOLN: 4.0000 mg | Freq: Four times a day (QID) | INTRAMUSCULAR | Status: DC | PRN

## 2019-01-22 MED ORDER — PROTAMINE SULFATE 10 MG/ML IV SOLN
INTRAVENOUS | Status: DC | PRN
  Administered 2019-01-22: 140 mg via INTRAVENOUS

## 2019-01-22 MED ORDER — ROCURONIUM BROMIDE 10 MG/ML (PF) SYRINGE
PREFILLED_SYRINGE | INTRAVENOUS | Status: DC | PRN
  Administered 2019-01-22: 60 mg via INTRAVENOUS
  Administered 2019-01-22 (×2): 20 mg via INTRAVENOUS

## 2019-01-22 MED ORDER — CARVEDILOL 12.5 MG PO TABS
12.5000 mg | ORAL_TABLET | Freq: Two times a day (BID) | ORAL | Status: DC
  Administered 2019-01-22 – 2019-01-23 (×2): 12.5 mg via ORAL
  Filled 2019-01-22 (×2): qty 1

## 2019-01-22 SURGICAL SUPPLY — 110 items
BAG DECANTER FOR FLEXI CONT (MISCELLANEOUS) IMPLANT
BAG SNAP BAND KOVER 36X36 (MISCELLANEOUS) ×4 IMPLANT
BLADE CLIPPER SURG (BLADE) IMPLANT
BLADE OSCILLATING /SAGITTAL (BLADE) IMPLANT
BLADE STERNUM SYSTEM 6 (BLADE) IMPLANT
BLADE SURG 10 STRL SS (BLADE) ×4 IMPLANT
CABLE ADAPT CONN TEMP 6FT (ADAPTER) ×4 IMPLANT
CANNULA FEM VENOUS REMOTE 22FR (CANNULA) IMPLANT
CANNULA OPTISITE PERFUSION 16F (CANNULA) IMPLANT
CANNULA OPTISITE PERFUSION 18F (CANNULA) IMPLANT
CATH DIAG EXPO 6F VENT PIG 145 (CATHETERS) ×8 IMPLANT
CATH EXPO 5FR AL1 (CATHETERS) IMPLANT
CATH EXTERNAL FEMALE PUREWICK (CATHETERS) IMPLANT
CATH INFINITI 6F AL2 (CATHETERS) ×2 IMPLANT
CATH S G BIP PACING (CATHETERS) ×4 IMPLANT
CHLORAPREP W/TINT 26ML (MISCELLANEOUS) ×4 IMPLANT
CLIP VESOCCLUDE MED 24/CT (CLIP) IMPLANT
CLIP VESOCCLUDE MED 6/CT (CLIP) ×2 IMPLANT
CLIP VESOCCLUDE SM WIDE 24/CT (CLIP) ×2 IMPLANT
CLIP VESOCCLUDE SM WIDE 6/CT (CLIP) ×2 IMPLANT
CONT SPEC 4OZ CLIKSEAL STRL BL (MISCELLANEOUS) ×8 IMPLANT
COVER BACK TABLE 24X17X13 BIG (DRAPES) IMPLANT
COVER BACK TABLE 80X110 HD (DRAPES) ×8 IMPLANT
COVER DOME SNAP 22 D (MISCELLANEOUS) IMPLANT
COVER WAND RF STERILE (DRAPES) ×4 IMPLANT
CRADLE DONUT ADULT HEAD (MISCELLANEOUS) ×4 IMPLANT
DECANTER SPIKE VIAL GLASS SM (MISCELLANEOUS) ×4 IMPLANT
DERMABOND ADVANCED (GAUZE/BANDAGES/DRESSINGS) ×2
DERMABOND ADVANCED .7 DNX12 (GAUZE/BANDAGES/DRESSINGS) ×2 IMPLANT
DEVICE CLOSURE PERCLS PRGLD 6F (VASCULAR PRODUCTS) ×4 IMPLANT
DRAPE CV SPLIT W-CLR ANES SCRN (DRAPES) ×2 IMPLANT
DRAPE INCISE IOBAN 66X45 STRL (DRAPES) IMPLANT
DRAPE PERI GROIN 82X75IN TIB (DRAPES) ×2 IMPLANT
DRSG TEGADERM 4X4.75 (GAUZE/BANDAGES/DRESSINGS) ×6 IMPLANT
ELECT CAUTERY BLADE 6.4 (BLADE) ×2 IMPLANT
ELECT REM PT RETURN 9FT ADLT (ELECTROSURGICAL) ×8
ELECTRODE REM PT RTRN 9FT ADLT (ELECTROSURGICAL) ×4 IMPLANT
FELT TEFLON 1X6 (MISCELLANEOUS) ×2 IMPLANT
FELT TEFLON 6X6 (MISCELLANEOUS) IMPLANT
FEMORAL VENOUS CANN RAP (CANNULA) IMPLANT
GAUZE 4X4 16PLY RFD (DISPOSABLE) ×2 IMPLANT
GAUZE SPONGE 4X4 12PLY STRL (GAUZE/BANDAGES/DRESSINGS) ×4 IMPLANT
GLOVE BIO SURGEON STRL SZ7.5 (GLOVE) ×4 IMPLANT
GLOVE BIO SURGEON STRL SZ8 (GLOVE) IMPLANT
GLOVE BIOGEL PI IND STRL 6.5 (GLOVE) IMPLANT
GLOVE BIOGEL PI INDICATOR 6.5 (GLOVE) ×2
GLOVE EUDERMIC 7 POWDERFREE (GLOVE) IMPLANT
GLOVE ORTHO TXT STRL SZ7.5 (GLOVE) IMPLANT
GOWN STRL REUS W/ TWL LRG LVL3 (GOWN DISPOSABLE) IMPLANT
GOWN STRL REUS W/ TWL XL LVL3 (GOWN DISPOSABLE) ×2 IMPLANT
GOWN STRL REUS W/TWL LRG LVL3 (GOWN DISPOSABLE)
GOWN STRL REUS W/TWL XL LVL3 (GOWN DISPOSABLE) ×2
GUIDEWIRE SAFE TJ AMPLATZ EXST (WIRE) ×4 IMPLANT
GUIDEWIRE STRAIGHT .035 260CM (WIRE) ×4 IMPLANT
INSERT FOGARTY SM (MISCELLANEOUS) IMPLANT
KIT BASIN OR (CUSTOM PROCEDURE TRAY) ×4 IMPLANT
KIT DILATOR VASC 18G NDL (KITS) IMPLANT
KIT HEART LEFT (KITS) ×4 IMPLANT
KIT SUCTION CATH 14FR (SUCTIONS) ×2 IMPLANT
KIT TURNOVER KIT B (KITS) ×4 IMPLANT
LOOP VESSEL MAXI BLUE (MISCELLANEOUS) ×2 IMPLANT
LOOP VESSEL MINI RED (MISCELLANEOUS) IMPLANT
NDL PERC 18GX7CM (NEEDLE) ×2 IMPLANT
NEEDLE 22X1 1/2 (OR ONLY) (NEEDLE) ×4 IMPLANT
NEEDLE PERC 18GX7CM (NEEDLE) ×4 IMPLANT
NS IRRIG 1000ML POUR BTL (IV SOLUTION) ×8 IMPLANT
PACK ENDO MINOR (CUSTOM PROCEDURE TRAY) ×4 IMPLANT
PACK ENDOVASCULAR (PACKS) ×4 IMPLANT
PAD ARMBOARD 7.5X6 YLW CONV (MISCELLANEOUS) ×8 IMPLANT
PAD ELECT DEFIB RADIOL ZOLL (MISCELLANEOUS) ×4 IMPLANT
PENCIL BUTTON HOLSTER BLD 10FT (ELECTRODE) ×4 IMPLANT
PERCLOSE PROGLIDE 6F (VASCULAR PRODUCTS)
SET MICROPUNCTURE 5F STIFF (MISCELLANEOUS) ×4 IMPLANT
SHEATH BRITE TIP 6FR 35CM (SHEATH) ×4 IMPLANT
SHEATH PINNACLE 6F 10CM (SHEATH) ×4 IMPLANT
SHEATH PINNACLE 8F 10CM (SHEATH) ×4 IMPLANT
SLEEVE REPOSITIONING LENGTH 30 (MISCELLANEOUS) ×4 IMPLANT
SPONGE LAP 4X18 RFD (DISPOSABLE) IMPLANT
STOPCOCK MORSE 400PSI 3WAY (MISCELLANEOUS) ×10 IMPLANT
SUT ETHIBOND X763 2 0 SH 1 (SUTURE) IMPLANT
SUT GORETEX CV 4 TH 22 36 (SUTURE) IMPLANT
SUT GORETEX CV4 TH-18 (SUTURE) ×4 IMPLANT
SUT MNCRL AB 3-0 PS2 18 (SUTURE) IMPLANT
SUT PROLENE 5 0 C 1 36 (SUTURE) ×2 IMPLANT
SUT PROLENE 6 0 C 1 30 (SUTURE) ×2 IMPLANT
SUT SILK  1 MH (SUTURE)
SUT SILK 1 MH (SUTURE) ×2 IMPLANT
SUT SILK 2 0 SH CR/8 (SUTURE) ×2 IMPLANT
SUT SILK 3 0 TIES 10X30 (SUTURE) ×2 IMPLANT
SUT VIC AB 1 CTX 36 (SUTURE) ×2
SUT VIC AB 1 CTX36XBRD ANBCTR (SUTURE) IMPLANT
SUT VIC AB 2-0 CT1 27 (SUTURE) ×2
SUT VIC AB 2-0 CT1 TAPERPNT 27 (SUTURE) IMPLANT
SUT VIC AB 2-0 CTX 36 (SUTURE) IMPLANT
SUT VIC AB 3-0 SH 8-18 (SUTURE) IMPLANT
SUT VIC AB 3-0 X1 27 (SUTURE) ×2 IMPLANT
SYR 50ML LL SCALE MARK (SYRINGE) ×4 IMPLANT
SYR BULB IRRIGATION 50ML (SYRINGE) ×2 IMPLANT
SYR CONTROL 10ML LL (SYRINGE) IMPLANT
SYR MEDRAD MARK V 150ML (SYRINGE) ×4 IMPLANT
TOWEL GREEN STERILE (TOWEL DISPOSABLE) ×8 IMPLANT
TOWEL GREEN STERILE FF (TOWEL DISPOSABLE) ×4 IMPLANT
TRANSDUCER W/STOPCOCK (MISCELLANEOUS) ×8 IMPLANT
TRAY FOLEY SLVR 16FR TEMP STAT (SET/KITS/TRAYS/PACK) IMPLANT
TUBE CONNECTING 20'X1/4 (TUBING) ×1
TUBE CONNECTING 20X1/4 (TUBING) ×1 IMPLANT
URINAL MALE W/LID DISP 1000CC (MISCELLANEOUS) IMPLANT
VALVE HEART TRANSCATH SZ3 29MM (Valve) ×2 IMPLANT
WIRE .035 3MM-J 145CM (WIRE) ×4 IMPLANT
YANKAUER SUCT BULB TIP NO VENT (SUCTIONS) ×2 IMPLANT

## 2019-01-22 NOTE — Progress Notes (Signed)
  [facility]   MULTIDISCIPLINARY HEART VALVE TEAM  Patient doing well s/p TAVR. He is hemodynamically stable. BP elevated. Resume home Coreg and IV hydralazine. Resume home lisinopril tomorrow if creat stable. Groin site and subclavian site stable. ECG with paced rhythm. Arterial line discontinued and transferred to 4E. Plan for early ambulation after bedrest completed and hopeful discharge over the next 24-48 hours.   Cline Crock PA-C  [facility]  Pager [phone]

## 2019-01-22 NOTE — Anesthesia Postprocedure Evaluation (Signed)
Anesthesia Post Note  Patient: Derrick Hill  Procedure(s) Performed: LEFT GROIN LINE ACCESS (N/A Chest) TRANSCATHETER AORTIC VALVE REPLACEMENT, LEFT SUBCLAVIAN ACCESS (N/A Chest) TRANSESOPHAGEAL ECHOCARDIOGRAM (TEE) (N/A )     Patient location during evaluation: PACU Anesthesia Type: General Level of consciousness: awake and alert Pain management: pain level controlled Vital Signs Assessment: post-procedure vital signs reviewed and stable Respiratory status: spontaneous breathing, nonlabored ventilation, respiratory function stable and patient connected to nasal cannula oxygen Cardiovascular status: blood pressure returned to baseline and stable Postop Assessment: no apparent nausea or vomiting Anesthetic complications: no    Last Vitals:  Vitals:   01/22/19 1803 01/22/19 1809  BP: (!) 142/66 (!) 142/66  Pulse: 70 70  Resp: 17   Temp:    SpO2: 94%     Last Pain:  Vitals:   01/22/19 1647  TempSrc: Axillary  PainSc: 0-No pain                 Shelton Silvas

## 2019-01-22 NOTE — Transfer of Care (Signed)
Immediate Anesthesia Transfer of Care Note  Patient: Derrick Hill  Procedure(s) Performed: LEFT GROIN LINE ACCESS (N/A Chest) TRANSCATHETER AORTIC VALVE REPLACEMENT, LEFT SUBCLAVIAN ACCESS (N/A Chest) TRANSESOPHAGEAL ECHOCARDIOGRAM (TEE) (N/A )  Patient Location: Cath Lab  Anesthesia Type:General  Level of Consciousness: awake, alert , oriented and patient cooperative  Airway & Oxygen Therapy: Patient Spontanous Breathing  Post-op Assessment: Report given to RN, Post -op Vital signs reviewed and stable and Patient moving all extremities X 4  Post vital signs: Reviewed and stable  Last Vitals:  Vitals Value Taken Time  BP 122/82 01/22/2019  3:05 PM  Temp    Pulse 70 01/22/2019  3:06 PM  Resp 13 01/22/2019  3:06 PM  SpO2 92 % 01/22/2019  3:06 PM  Vitals shown include unvalidated device data.  Last Pain:  Vitals:   01/22/19 1504  PainSc: 5       Patients Stated Pain Goal: 2 (01/22/19 1026)  Complications: No apparent anesthesia complications

## 2019-01-22 NOTE — Anesthesia Procedure Notes (Signed)
Arterial Line Insertion Start/End5/19/2020 10:45 AM Performed by: Rachel Moulds, CRNA  Patient location: Pre-op. Preanesthetic checklist: patient identified, IV checked, site marked, risks and benefits discussed, surgical consent, monitors and equipment checked, pre-op evaluation, timeout performed and anesthesia consent Right, radial was placed Catheter size: 20 G Hand hygiene performed  and maximum sterile barriers used   Attempts: 2 Procedure performed without using ultrasound guided technique. Following insertion, Biopatch and dressing applied. Post procedure assessment: normal

## 2019-01-22 NOTE — Op Note (Signed)
[facility]   MULTIDISCIPLINARY HEART VALVE TEAM   TAVR OPERATIVE NOTE   Date of Procedure:  01/22/2019  Preoperative Diagnosis: Severe Aortic Stenosis   Postoperative Diagnosis: Same   Procedure:   Transcatheter Aortic Valve Replacement - Left axillary artery approach   Edwards Sapien 3 THV (size 29 mm, model # 9600TFX, serial # [id number])   Co-Surgeons: Alleen BorneBryan K. Cintia Gleed, MD and Verne Carrowhristopher McAlhany, MD  Anesthesiologist:  Shona SimpsonKevin Hollis, MD  Echocardiographer:  Charlton HawsPeter Nishan, MD  Pre-operative Echo Findings:  Severe aortic stenosis  Severe  left ventricular systolic dysfunction  Post-operative Echo Findings:  trivial paravalvular leak  Unchanged severe left ventricular systolic dysfunction   BRIEF CLINICAL NOTE AND INDICATIONS FOR SURGERY  This 84 year old gentleman has stage D, severe, symptomatic aortic stenosis with NYHA class III symptoms of exertional fatigue and shortness of breath with an LVEF of 25% consistent with chronic combined systolic and diastolic congestive heart failure. I have personally reviewed his echo, cath and CTA studies. His most recent echo showed a calcified aortic valve with poor leaflet mobility that looks like a severely stenotic valve although the mean gradient was only 20 mm Hg. His EF has decreased to 25% from 35-45% 6 months ago and I think he has low gradient, low flow, severe AS. His cath showed3/4 patent grafts and a mean AV gradient of 9 mm Hg with a peak to peak of 14 mm Hg and severe pulmonary hypertension at that time. He only had mild MR by recent echo. I think TAVR is the best treatment to try to improve his congestive heart failure symptoms and prevent further LV deterioration. His gated cardiac CTA shows anatomy suitable for TAVR using a 29 mm Sapien 3 valve. There is some calcification of the sinotubular junction but it is not circumferential. His abdominal and pelvic CTA shows severe aortailiac calcific atherosclerosis  and the right common iliac has circumferential calcification with borderline diameter. The left common iliac has borderline diameter but the calcification is not circumferential so it may be possible to perform a transfemoral insertion on the left side. The left subclavian artery has an adequate diameter with some focal calcification at the origin but I think it is probably adequate. The left carotid artery is too small. Transapical insertion is not possible since there is an epicardial pacing lead at this location.  The patientand his wife werecounseled at length regarding treatment alternatives for management of severe symptomatic aortic stenosis. The risks and benefits of surgical intervention has been discussed in detail. Long-term prognosis with medical therapy was discussed. Alternative approaches such as conventional surgical aortic valve replacement, transcatheter aortic valve replacement, and palliative medical therapy were compared and contrasted at length. This discussion was placed in the context of the patient's own specific clinical presentation and past medical history. All of their questions havebeen addressed.  Following the decision to proceed with transcatheter aortic valve replacement, a discussion was held regarding what types of management strategies would be attempted intraoperatively in the event of life-threatening complications, including whether or not the patient would be considered a candidate for the use of cardiopulmonary bypass and/or conversion to open sternotomy for attempted surgical intervention. The patient is aware of the fact that transient use of cardiopulmonary bypass may be necessary. With previous CABG and severe LV systolic dysfunction at 84 years old I would not consider him a candidate for sternotomy to address any intraop complications and he and his wife understand that.   The patient  has been advised of a variety of complications that might develop  including but not limited to risks of death, stroke, paravalvular leak, aortic dissection or other major vascular complications, aortic annulus rupture, device embolization, cardiac rupture or perforation, mitral regurgitation, acute myocardial infarction, arrhythmia, heart block or bradycardia requiring permanent pacemaker placement, congestive heart failure, respiratory failure, renal failure, pneumonia, infection, other late complications related to structural valve deterioration or migration, or other complications that might ultimately cause a temporary or permanent loss of functional independence or other long term morbidity. The patient provides full informed consent for the procedure as described and all questions were answered.   DETAILS OF THE OPERATIVE PROCEDURE  PREPARATION:    The patient is brought to the operating room on the above mentioned date and appropriate monitoring was established by the anesthesia team. The patient is placed in the supine position on the operating table.  Intravenous antibiotics are administered.  General endotracheal anesthesia is induced uneventfully.    Baseline transesophageal echocardiogram was performed. The patient's chest, abdomen, both groins, and both lower extremities are prepared and draped in a sterile manner. A time out procedure is performed.   PERIPHERAL ACCESS:    Using the modified Seldinger technique, femoral arterial and venous access was obtained with placement of 6 Fr sheaths on the left side.  A pigtail diagnostic catheter was passed through the left arterial sheath under fluoroscopic guidance into the aortic root.  A temporary transvenous pacemaker catheter was passed through the left femoral venous sheath under fluoroscopic guidance into the right ventricle.  The pacemaker was tested to ensure stable lead placement and pacemaker capture. Aortic root angiography was performed in order to determine the optimal angiographic angle for valve  deployment.   TRANSAXILLARY ACCESS:   A transverse incision was made below the left clavicle. The pectoralis major muscle was split along its fibers and the pectoralis minor muscle was retracted laterally. The brachial plexus was identified and avoided. The axillary artery was easily identified and  controlled with a vessel loop. It had no palpable disease in it. The patient was heparinized systemically and ACT verified > 250 seconds. A double pursestring suture of CV-4 gortex with small gortex pledgetts was placed in the anterior wall of the artery. The artery was cannulated with a needle and a J-wire advanced into the ascending aorta. A 82F sheath was inserted and an AL-1 catheter was advanced to the ascending aorta. This was used to direct a straight-tip exchange length wire across the native aortic valve into the left ventricle. This was exchanged out for a pigtail catheter and position was confirmed in the LV apex. Simultaneous LV and Ao pressures were recorded.  The pigtail catheter was exchanged for an Amplatz Extra-stiff wire in the LV apex. Echocardiography was utilized to confirm appropriate wire position and no sign of entanglement in the mitral subvalvular apparatus. Then a 182F E-sheath was inserted over this stiff wire so that the tip of the sheath was in the aortic arch.    BALLOON AORTIC VALVULOPLASTY:   Not performed   TRANSCATHETER HEART VALVE DEPLOYMENT:   An Edwards Sapien 3 transcatheter heart valve (size 29 mm, model #9600TFX, serial #[id number]) was prepared and crimped per manufacturer's guidelines, and the proper orientation of the valve is confirmed on the [employer] delivery system. The valve was advanced through the introducer sheath using normal technique until in an appropriate position in the ascending aorta beyond the sheath tip. The balloon was then retracted and using the  fine-tuning wheel was centered on the valve. The valve was then advanced across the aortic  valve. The Commander catheter was retracted using normal technique. Once final position of the valve has been confirmed by angiographic assessment, the valve is deployed while temporarily holding ventilation and during rapid ventricular pacing to maintain systolic blood pressure < 50 mmHg and pulse pressure < 10 mmHg. The balloon inflation is held for >3 seconds after reaching full deployment volume. Once the balloon has fully deflated the balloon is retracted into the ascending aorta and valve function is assessed using echocardiography. There is felt to be trivial paravalvular leak and no central aortic insufficiency.  The patient's hemodynamic recovery following valve deployment is good.  The deployment balloon and guidewire are both removed.    PROCEDURE COMPLETION:   The sheath was removed from the axillary artery and the sutures tied.   Protamine was administered once axillary arterial repair was complete. The temporary pacemaker, pigtail catheters and femoral sheaths were removed with manual pressure used for hemostasis. The subclavicular incision was closed in layers with 0-Vicryl for the muscle, 2-0 Vicryl for subcutaneous tissue and 3-0 Vicryl subcuticular skin suture.  The patient tolerated the procedure well and is transported to the surgical intensive care in stable condition. There were no immediate intraoperative complications. All sponge instrument and needle counts are verified correct at completion of the operation.   No blood products were administered during the operation.  The patient received a total of 40 mL of intravenous contrast during the procedure.   [name], MD 01/22/2019 3:01 PM

## 2019-01-22 NOTE — Interval H&P Note (Signed)
History and Physical Interval Note:  01/22/2019 11:35 AM  Derrick Hill  has presented today for surgery, with the diagnosis of Severe Aortic Stenosis.  The various methods of treatment have been discussed with the patient and family. After consideration of risks, benefits and other options for treatment, the patient has consented to  Procedure(s): TRANSCATHETER AORTIC VALVE REPLACEMENT, TRANSFEMORAL (N/A) TRANSCATHETER AORTIC VALVE REPLACEMENT, POSSIBLE LEFT SUBCLAVIAN (N/A) TRANSESOPHAGEAL ECHOCARDIOGRAM (TEE) (N/A) as a surgical intervention.  The patient's history has been reviewed, patient examined, no change in status, stable for surgery.  I have reviewed the patient's chart and labs.  Questions were answered to the patient's satisfaction.     Shortness of breath: Yes.   If yes: with what activity?: any Worse than previously noted?: No.  New edema, PND, orthopnea: No.  Recent decrease in activity i.e. more difficulty walking to mailbox, climbing stairs, etc: No.  Changes in sleeping i.e. need to utilize to sleep on more pillows, sitting up, etc: No.  Changes since last seen in pre-op visit: No.   Preop BNP on 01/18/2019 is 682 consistent with acute on chronic diastolic heart failure.  [name]

## 2019-01-22 NOTE — Progress Notes (Signed)
  Echocardiogram Echocardiogram Transesophageal has been performed.  Janalyn Harder 01/22/2019, 3:11 PM

## 2019-01-22 NOTE — CV Procedure (Signed)
[facility]  TAVR OPERATIVE NOTE   Date of Procedure:  01/22/2019  Preoperative Diagnosis: Severe Aortic Stenosis   Postoperative Diagnosis: Same   Procedure:    Transcatheter Aortic Valve Replacement - Transfemoral Approach  Edwards Sapien 3 THV (size 29 mm, model # [id number], serial # [id number])   Co-Surgeons:  Verne Carrow, MD and [name], MD  Anesthesiologist:  Hart Rochester  Echocardiographer:  Eden Emms  Pre-operative Echo Findings:  Severe aortic stenosis  Moderate left ventricular systolic dysfunction  Post-operative Echo Findings:  No paravalvular leak  Moderate left ventricular systolic function  BRIEF CLINICAL NOTE AND INDICATIONS FOR SURGERY  84 yo male with history of arthritis, ischemic cardiomyopathy, CAD, GERD, HTN, hyperlipidemia, chronic systolic CHF, paroxysmal atrial fibrillation and severe aortic stenosis here today for TAVR.Marland Kitchen He is known to have severe aortic stenosis. He underwent TAVR workup at [facility] in 2015 but ultimately he did not proceed with TAVR as he was told it may not improve his symptoms. He has been followed in the [place] in [place], [place] since then. Echo August 2019 with LVEF=35-40%. The aortic valve leaflets were thickened and calcified. Mean gradient 27 mmHg. AVA 0.44 cm2, Dimensionless index 0.15. He is felt to have low flow, low gradient aortic stenosis. He is known to have CAD. He underwent 4V CABG in 1994 (LIMA to LAD, SVG to ramus, SVG to Diagonal, SVG to PDA). PCI of the first obtuse marginal in 2014 with placement of a 2.5 x 16 mm Promus drug eluting stent. Most recent cardiac cath February 2018 with 4/4 patent grafts with high grade disease in the SVG to the ramus intermediate treated with a drug eluting stent. He has paroxysmal atrial fibrillation and has undergone ablation. He has had non-sustained ventricular tachycardia and an ICD is in place. This was replaced in 2016 due to enterococcal  bacteremia. He has chronic systolic CHF, HTN, hyperlipidemia and GERD. He has been followed at [facility] by Dr. Adella Hare and was last seen there in October 2019. He was told then that it was not clear if a valve replacement would improve his symptoms so he elected not to proceed. Cath with 3/4 patent grafts.   During the course of the patient's preoperative work up they have been evaluated comprehensively by a multidisciplinary team of specialists coordinated through the Multidisciplinary Heart Valve Clinic in the [facility].  They have been demonstrated to suffer from symptomatic severe aortic stenosis as noted above. The patient has been counseled extensively as to the relative risks and benefits of all options for the treatment of severe aortic stenosis including long term medical therapy, conventional surgery for aortic valve replacement, and transcatheter aortic valve replacement.  The patient has been independently evaluated by Dr. Laneta Simmers with CT surgery and they are felt to be at high risk for conventional surgical aortic valve replacement. The surgeon indicated the patient would be a poor candidate for conventional surgery. Based upon review of all of the patient's preoperative diagnostic tests they are felt to be candidate for transcatheter aortic valve replacement using the trans-subclavian approach as an alternative to high risk conventional surgery.    Following the decision to proceed with transcatheter aortic valve replacement, a discussion has been held regarding what types of management strategies would be attempted intraoperatively in the event of life-threatening complications, including whether or not the patient would be considered a candidate for the use of cardiopulmonary bypass and/or conversion to open sternotomy  for attempted surgical intervention.  The patient has been advised of a variety of complications that might develop peculiar to this  approach including but not limited to risks of death, stroke, paravalvular leak, aortic dissection or other major vascular complications, aortic annulus rupture, device embolization, cardiac rupture or perforation, acute myocardial infarction, arrhythmia, heart block or bradycardia requiring permanent pacemaker placement, congestive heart failure, respiratory failure, renal failure, pneumonia, infection, other late complications related to structural valve deterioration or migration, or other complications that might ultimately cause a temporary or permanent loss of functional independence or other long term morbidity.  The patient provides full informed consent for the procedure as described and all questions were answered preoperatively.    DETAILS OF THE OPERATIVE PROCEDURE  PREPARATION:   The patient is brought to the operating room on the above mentioned date and central monitoring was established by the anesthesia team including placement of a radial arterial line. The patient is placed in the supine position on the operating table.  Intravenous antibiotics are administered. Conscious sedation is used.   Baseline transesophageal echocardiogram was performed. The patient's chest, abdomen, both groins, and both lower extremities are prepared and draped in a sterile manner. A time out procedure is performed.   PERIPHERAL ACCESS:   Using the modified Seldinger technique, femoral arterial and venous access were obtained with placement of 6 Fr sheaths on the left side using u/s guidance.  A pigtail diagnostic catheter was passed through the femoral arterial sheath under fluoroscopic guidance into the aortic root.  A temporary transvenous pacemaker catheter was passed through the femoral venous sheath under fluoroscopic guidance into the right ventricle.  The pacemaker was tested to ensure stable lead placement and pacemaker capture. Aortic root angiography was performed in order to determine the optimal  angiographic angle for valve deployment.  TRANS-SUBCLAVIAN ACCESS:  Dr. Laneta Simmers performed access into the left subclavian artery/axillary artery. Please see his operative note. After access obtained in the axillary artery, An AL-1 catheter was used to cross the aortic valve. I then changed out this wire over a pigtail catheter for a Amplatz extra stiff wire.    TRANSCATHETER HEART VALVE DEPLOYMENT:  An Edwards Sapien 3 THV (size 29 mm) was prepared and crimped per manufacturer's guidelines, and the proper orientation of the valve is confirmed on the [employer] delivery system. The valve was advanced through the introducer sheath using normal technique until in an appropriate position in the aorta beyond the sheath tip. The balloon was then retracted and using the fine-tuning wheel was centered on the valve. The valve was then advanced across the aortic valve.  The valve was carefully positioned across the aortic valve annulus. The Commander catheter was retracted using normal technique. Once final position of the valve has been confirmed by angiographic assessment, the valve is deployed while temporarily holding ventilation and during rapid ventricular pacing to maintain systolic blood pressure < 50 mmHg and pulse pressure < 10 mmHg. The balloon inflation is held for >3 seconds after reaching full deployment volume. Once the balloon has fully deflated the balloon is retracted into the ascending aorta and valve function is assessed using TEE. There is felt to be no paravalvular leak and no central aortic insufficiency.  The patient's hemodynamic recovery following valve deployment is good.  The deployment balloon and guidewire are both removed. Echo demostrated acceptable post-procedural gradients, stable mitral valve function, and no AI.   PROCEDURE COMPLETION:  The sheath was then removed and the wound was closed.  See Dr. Garen Grams note for details. The temporary pacemaker, pigtail catheters and  femoral sheaths were removed.   The patient tolerated the procedure well and is transported to the surgical intensive care in stable condition. There were no immediate intraoperative complications. All sponge instrument and needle counts are verified correct at completion of the operation.   No blood products were administered during the operation.  The patient received a total of  40 mL of intravenous contrast during the procedure.  Verne Carrow MD 01/22/2019 2:40 PM

## 2019-01-22 NOTE — Anesthesia Procedure Notes (Signed)
Arterial Line Insertion Start/End5/19/2020 12:05 PM, 01/22/2019 12:12 PM Performed by: Shelton Silvas, MD, anesthesiologist  Patient location: Pre-op. Preanesthetic checklist: patient identified, IV checked, site marked, risks and benefits discussed, surgical consent, monitors and equipment checked, pre-op evaluation, timeout performed and anesthesia consent Lidocaine 1% used for infiltration Right, radial was placed Catheter size: 20 Fr Hand hygiene performed  and maximum sterile barriers used   Attempts: 1 Procedure performed without using ultrasound guided technique. Following insertion, dressing applied. Post procedure assessment: normal and unchanged  Patient tolerated the procedure well with no immediate complications.

## 2019-01-22 NOTE — Anesthesia Procedure Notes (Signed)
Procedure Name: Intubation Date/Time: 01/22/2019 12:45 PM Performed by: Julieta Bellini, CRNA Pre-anesthesia Checklist: Patient identified, Emergency Drugs available, Suction available and Patient being monitored Patient Re-evaluated:Patient Re-evaluated prior to induction Oxygen Delivery Method: Circle system utilized Preoxygenation: Pre-oxygenation with 100% oxygen Induction Type: IV induction and Rapid sequence Ventilation: Mask ventilation without difficulty Laryngoscope Size: Mac and 4 Grade View: Grade I Tube type: Oral Tube size: 7.5 mm Number of attempts: 1 Airway Equipment and Method: Stylet and Oral airway Placement Confirmation: ETT inserted through vocal cords under direct vision,  positive ETCO2 and breath sounds checked- equal and bilateral Secured at: 22 cm Tube secured with: Tape Dental Injury: Teeth and Oropharynx as per pre-operative assessment

## 2019-01-23 ENCOUNTER — Inpatient Hospital Stay (HOSPITAL_COMMUNITY): Payer: No Typology Code available for payment source

## 2019-01-23 ENCOUNTER — Other Ambulatory Visit: Payer: Self-pay | Admitting: Physician Assistant

## 2019-01-23 ENCOUNTER — Encounter (HOSPITAL_COMMUNITY): Payer: Self-pay | Admitting: Cardiovascular Disease

## 2019-01-23 DIAGNOSIS — Z952 Presence of prosthetic heart valve: Secondary | ICD-10-CM

## 2019-01-23 DIAGNOSIS — I350 Nonrheumatic aortic (valve) stenosis: Secondary | ICD-10-CM

## 2019-01-23 DIAGNOSIS — Z954 Presence of other heart-valve replacement: Secondary | ICD-10-CM

## 2019-01-23 LAB — CBC
HCT: 34.9 % — ABNORMAL LOW (ref 39.0–52.0)
Hemoglobin: 12.3 g/dL — ABNORMAL LOW (ref 13.0–17.0)
MCH: 37.8 pg — ABNORMAL HIGH (ref 26.0–34.0)
MCHC: 35.2 g/dL (ref 30.0–36.0)
MCV: 107.4 fL — ABNORMAL HIGH (ref 80.0–100.0)
Platelets: 115 10*3/uL — ABNORMAL LOW (ref 150–400)
RBC: 3.25 MIL/uL — ABNORMAL LOW (ref 4.22–5.81)
RDW: 14.1 % (ref 11.5–15.5)
WBC: 8.8 10*3/uL (ref 4.0–10.5)
nRBC: 0 % (ref 0.0–0.2)

## 2019-01-23 LAB — BASIC METABOLIC PANEL
Anion gap: 10 (ref 5–15)
BUN: 22 mg/dL (ref 8–23)
CO2: 23 mmol/L (ref 22–32)
Calcium: 8.7 mg/dL — ABNORMAL LOW (ref 8.9–10.3)
Chloride: 105 mmol/L (ref 98–111)
Creatinine, Ser: 1.23 mg/dL (ref 0.61–1.24)
GFR calc Af Amer: >60 mL/min (ref >60)
GFR calc non Af Amer: 54 mL/min — ABNORMAL LOW (ref >60)
Glucose, Bld: 180 mg/dL — ABNORMAL HIGH (ref 70–99)
Potassium: 3.8 mmol/L (ref 3.5–5.1)
Sodium: 138 mmol/L (ref 135–145)

## 2019-01-23 LAB — MAGNESIUM: Magnesium: 1.8 mg/dL (ref 1.7–2.4)

## 2019-01-23 LAB — ECHOCARDIOGRAM LIMITED
Height: 66 in
Weight: 2920.65 oz

## 2019-01-23 MED ORDER — PERFLUTREN LIPID MICROSPHERE
1.0000 mL | INTRAVENOUS | Status: AC | PRN
  Administered 2019-01-23: 09:00:00 2 mL via INTRAVENOUS
  Filled 2019-01-23: qty 10

## 2019-01-23 NOTE — Progress Notes (Addendum)
Patient in a stable condition, discharge education completed , patient verbalized understanding, patient belongings at bedside, TAVR ID card not found in the patient's chart, patient awaiting his wife for transportation home. IV removed, tele dc, ccmd notified.

## 2019-01-23 NOTE — Progress Notes (Signed)
1 Day Post-Op Procedure(s) (LRB): LEFT GROIN LINE ACCESS (N/A) TRANSCATHETER AORTIC VALVE REPLACEMENT, LEFT SUBCLAVIAN ACCESS (N/A) TRANSESOPHAGEAL ECHOCARDIOGRAM (TEE) (N/A) Subjective: No complaints. Ambulated down the hall and back without difficulty and felt better. No pain.  Objective: Vital signs in last 24 hours: Temp:  [96.3 F (35.7 C)-98.3 F (36.8 C)] 98.3 F (36.8 C) (05/20 0745) Pulse Rate:  [58-87] 69 (05/20 0745) Cardiac Rhythm: A-V Sequential paced (05/20 0300) Resp:  [13-26] 26 (05/20 0745) BP: (116-187)/(56-103) 130/72 (05/20 0745) SpO2:  [90 %-100 %] 96 % (05/20 0745) Weight:  [82.4 kg-82.8 kg] 82.8 kg (05/20 0500)  Hemodynamic parameters for last 24 hours:    Intake/Output from previous day: 05/19 0701 - 05/20 0700 In: 2453.7 [P.O.:840; I.V.:1313.6; IV Piggyback:300.1] Out: 395 [Urine:375; Blood:20] Intake/Output this shift: Total I/O In: 120 [P.O.:120] Out: -   General appearance: alert and cooperative Neurologic: intact Heart: irregularly irregular rhythm, 1/6 systolic murmur LLSB Lungs: clear to auscultation bilaterally Extremities: extremities normal, atraumatic, no cyanosis or edema, left radial pulse palpable. Wound: incision ok with mild ecchymosis, left groin site ok  Lab Results: Recent Labs    01/22/19 1540 01/23/19 0256  WBC  --  8.8  HGB 12.9* 12.3*  HCT 38.0* 34.9*  PLT  --  115*   BMET:  Recent Labs    01/22/19 1423 01/22/19 1540 01/23/19 0256  NA 141 141 138  K 3.9 3.9 3.8  CL  --   --  105  CO2  --   --  23  GLUCOSE 124*  --  180*  BUN  --   --  22  CREATININE  --  1.20 1.23  CALCIUM  --   --  8.7*    PT/INR: No results for input(s): LABPROT, INR in the last 72 hours. ABG    Component Value Date/Time   PHART 7.334 (L) 01/22/2019 1540   HCO3 23.4 01/22/2019 1540   TCO2 25 01/22/2019 1540   ACIDBASEDEF 3.0 (H) 01/22/2019 1540   O2SAT 99.0 01/22/2019 1540   CBG (last 3)  No results for input(s): GLUCAP in the  last 72 hours.  Assessment/Plan: S/P Procedure(s) (LRB): LEFT GROIN LINE ACCESS (N/A) TRANSCATHETER AORTIC VALVE REPLACEMENT, LEFT SUBCLAVIAN ACCESS (N/A) TRANSESOPHAGEAL ECHOCARDIOGRAM (TEE) (N/A)  Hemodynamically stable. Continue previous heart failure meds with EF 25%.  Plan to resume Coumadin and ASA 81 at discharge.  2D echo pending this am.  Continue ambulation. He could go home later today if he feels up to it, otherwise tomorrow.   LOS: 1 day    [name] 01/23/2019

## 2019-01-23 NOTE — Discharge Instructions (Signed)
ACTIVITY AND EXERCISE °• Daily activity and exercise are an important part of your recovery. People recover at different rates depending on their general health and type of valve procedure. °• Most people recovering from TAVR feel better relatively quickly  °• No lifting, pushing, pulling more than 10 pounds (examples to avoid: groceries, vacuuming, gardening, golfing): °            - For one week with a procedure through the groin. °            - For six weeks for procedures through the chest wall or neck °NOTE: You will typically see one of our providers 7-14 days after your procedure to discuss WHEN TO RESUME the above activities.  °  °  °DRIVING °• Do not drive for until you are seen for follow up and cleared by a provider. Generally, we ask patient to not drive for 1 week after their procedure. °• If you have been told by your doctor in the past that you may not drive, you must talk with him/her before you begin driving again. °  °  °DRESSING °• Groin site: you may leave the clear dressing over the site for up to one week or until it falls off. °  °  °HYGIENE °• If you had a femoral (leg) procedure, you may take a shower when you return home. After the shower, pat the site dry. Do NOT use powder, oils or lotions in your groin area until the site has completely healed. °• If you had a chest procedure, you may shower when you return home unless specifically instructed not to by your discharging practitioner. °            - DO NOT scrub incision; pat dry with a towel °            - DO NOT apply any lotions, oils, powders to the incision °            - No tub baths / swimming for at least 2 weeks. °• If you notice any fevers, chills, increased pain, swelling, bleeding or pus, please contact your doctor. °  °ADDITIONAL INFORMATION °• If you are going to have an upcoming dental procedure, please contact our office as you will require antibiotics ahead of time to prevent infection on your heart valve.  ° ° °If you  have any questions or concerns you can call the structural heart phone during normal business hours 8am-4pm. If you have an urgent need after hours or weekends please call [phone] to talk to the on call provider for general cardiology. If you have an emergency that requires immediate attention, please call 911.  ° ° °After TAVR Checklist ° °Check  Test Description  ° Follow up appointment in 1-2 weeks  You will see our structural heart physician assistant, Katie Rivers Gassmann. Your incision sites will be checked and you will be cleared to drive and resume all normal activities if you are doing well.    ° 1 month echo and follow up  You will have an echo to check on your new heart valve and be seen back in the office by Katie Cecillia Menees. Many times the echo is not read by your appointment time, but Katie will call you later that day or the following day to report your results.  ° Follow up with your primary cardiologist You will need to be seen by your primary cardiologist in the following 3-6 months after your 1   month appointment in the valve clinic. Often times your Plavix or Aspirin will be discontinued during this time, but this is decided on a case by case basis.   ° 1 year echo and follow up You will have another echo to check on your heart valve after 1 year and be seen back in the office by Katie Marcellus Pulliam. This your last structural heart visit.  ° Bacterial endocarditis prophylaxis  You will have to take antibiotics for the rest of your life before all dental procedures (even teeth cleanings) to protect your heart valve. Antibiotics are also required before some surgeries. Please check with your cardiologist before scheduling any surgeries. Also, please make sure to tell us if you have a penicillin allergy as you will require an alternative antibiotic.   ° ° ° ° °HE[facility] AND [facility]ENTER   °MULTIDISCIPLINARY HEART VALVE TEAM ° ° °YOUR CARDIOLOGY TEAM HAS ARRANGED FOR AN E-VISIT FOR YOUR APPOINTMENT - PLEASE  REVIEW IMPORTANT INFORMATION BELOW SEVERAL DAYS PRIOR TO YOUR APPOINTMENT ° °Due to the recent COVID-19 pandemic, we are transitioning in-person office visits to tele-medicine visits in an effort to decrease unnecessary exposure to our patients, their families, and staff. These visits are billed to your insurance just like a normal visit is. We also encourage you to sign up for MyChart if you have not already done so. You will need a smartphone if possible. For patients that do not have this, we can still complete the visit using a regular telephone but do prefer a smartphone to enable video when possible. You may have a family member that lives with you that can help. If possible, we also ask that you have a blood pressure cuff and scale at home to measure your blood pressure, heart rate and weight prior to your scheduled appointment. Patients with clinical needs that need an in-person evaluation and testing will still be able to come to the office if absolutely necessary. If you have any questions, feel free to call our office. ° ° ° °YOUR PROVIDER WILL BE USING THE FOLLOWING PLATFORM TO COMPLETE YOUR VISIT: [email or web address] °All you need is a smart phone. You will receive a text message from the provider through the [email or web address] app. You will follow the prompts and it will take you to a virtual visit with your provider. Please check your blood pressure, heart rate and weight prior to your scheduled appointment. ° °CONSENT FOR TELE-HEALTH VISIT - PLEASE REVIEW ° °I hereby voluntarily request, consent and authorize [facility] and its employed or contracted physicians, physician assistants, nurse practitioners or other licensed health care professionals (the Practitioner), to provide me with telemedicine health care services (the “Services") as deemed necessary by the treating Practitioner. I acknowledge and consent to receive the Services by the Practitioner via telemedicine. I understand that the telemedicine visit will  involve communicating with the Practitioner through live audiovisual communication technology and the disclosure of certain medical information by electronic transmission. I acknowledge that I have been given the opportunity to request an in-person assessment or other available alternative prior to the telemedicine visit and am voluntarily participating in the telemedicine visit. ° °I understand that I have the right to withhold or withdraw my consent to the use of telemedicine in the course of my care at any time, without affecting my right to future care or treatment, and that the Practitioner or I may terminate the telemedicine visit at any time. I understand that I have the right to inspect all information obtained   and/or recorded in the course of the telemedicine visit and may receive copies of available information for a reasonable fee.  I understand that some of the potential risks of receiving the Services via telemedicine include:  °• Delay or interruption in medical evaluation due to technological equipment failure or disruption; °• Information transmitted may not be sufficient (e.g. poor resolution of images) to allow for appropriate medical decision making by the Practitioner; and/or  °• In rare instances, security protocols could fail, causing a breach of personal health information. ° °Furthermore, I acknowledge that it is my responsibility to provide information about my medical history, conditions and care that is complete and accurate to the best of my ability. I acknowledge that Practitioner's advice, recommendations, and/or decision may be based on factors not within their control, such as incomplete or inaccurate data provided by me or distortions of diagnostic images or specimens that may result from electronic transmissions. I understand that the practice of medicine is not an exact science and that Practitioner makes no warranties or guarantees regarding treatment outcomes. I acknowledge that  I will receive a copy of this consent concurrently upon execution via email to the email address I last provided but may also request a printed copy by calling the office of [facility].   ° °I understand that my insurance will be billed for this visit.  ° °I have read or had this consent read to me. °• I understand the contents of this consent, which adequately explains the benefits and risks of the Services being provided via telemedicine.  °• I have been provided ample opportunity to ask questions regarding this consent and the Services and have had my questions answered to my satisfaction. °• I give my informed consent for the services to be provided through the use of telemedicine in my medical care ° °By participating in this telemedicine visit I agree to the above. °

## 2019-01-23 NOTE — Progress Notes (Signed)
Cardiac Rehab Advisory Cardiac Rehab Phase I is not seeing pts face to face at this time due to Covid 19 restrictions. Ambulation is occurring through nursing, PT, and mobility teams. We will help facilitate that process as needed. We are calling pts in their rooms and discussing education. We will then deliver education materials to pts RN for delivery to pt.   Spoke to pt by phone. Discussed walking daily, restrictions, low sodium diet, daily wts, and CRPII. He is feeling good this morning, did well walking with RW. He has a rollator at home and will walk his cul-de-sac. He has been following a low sodium diet for the past 12 years and also weighs daily. He declined a HF booklet and also CRPII.  [phone] Ethelda Chick CES, ACSM 9:47 AM 01/23/2019

## 2019-01-23 NOTE — Progress Notes (Signed)
  Echocardiogram 2D Echocardiogram limited with definity has been performed.  Leta Jungling M 01/23/2019, 9:00 AM

## 2019-01-23 NOTE — Discharge Summary (Addendum)
[facility]   MULTIDISCIPLINARY HEART VALVE TEAM  Discharge Summary    Patient ID: Derrick Hill MRN: [id number]; DOB: [birth year]  Admit date: 01/22/2019 Discharge date: 01/23/2019  Primary Care Provider: Day, Hill Dupre, MD  Primary Cardiologist: Dr. Adella Hare / Dr. Clifton James & Dr. Laneta Simmers (TAVR)   Discharge Diagnoses    Principal Problem:   S/P TAVR (transcatheter aortic valve replacement) Active Problems:   Coronary artery disease involving native coronary artery of native heart without angina pectoris   Severe aortic stenosis   Atrial fibrillation (HCC)   Chronic hepatitis C virus infection (HCC)   Paroxysmal ventricular tachycardia (HCC)   History of atrioventricular nodal ablation   Hypertension   Hyperlipidemia   Acute on chronic combined systolic and diastolic CHF (congestive heart failure) (HCC)   Ischemic cardiomyopathy   Allergies Allergies  Allergen Reactions   Anesthetics, Ester Other (See Comments)    GI UPSET    Diagnostic Studies/Procedures     TAVR OPERATIVE NOTE   Date of Procedure:                01/22/2019  Preoperative Diagnosis:      Severe Aortic Stenosis   Postoperative Diagnosis:    Same   Procedure:       Transcatheter Aortic Valve Replacement - Left axillary artery approach              Edwards Sapien 3 THV (size 29 mm, model # 9600TFX, serial # [id number])              Co-Surgeons:            [name], MD and Verne Carrow, MD  Anesthesiologist:                  Shona Simpson, MD  Echocardiographer:              Charlton Haws, MD  Pre-operative Echo Findings: ? Severe aortic stenosis ? Severe  left ventricular systolic dysfunction  Post-operative Echo Findings: ? trivial paravalvular leak ? Unchanged severe left ventricular systolic dysfunction _____________   Echo 01/23/19: pending formal read at the time of discharge.   History of Present Illness     Derrick Hill is a 84 y.o.  male with a history of HTN, hyperlipidemia, PAF on Coumadin, ischemic cardiomyopathy and chronic systolic heart failure, CAD s/p CABG x 4 (LIMA to LAD, SVG to ramus, SVG to Diagonal, SVG to PDA) at [facility], PCI of the OM1 in 2014 with DES, NSVT s/p ICD which had to be removed and replaced in 2016 due to enterococcal bacteremia and known severe aortic stenosis who presented to [facility] on 01/22/19 for planned TAVR.   He was seen by Dr. Adella Hare at [facility] and underwent TAVR workup in 2015 but did not proceed with TAVR because he was told that it may not improve his symptoms. He has been followed at the [place] in [place] since then. An echo in 04/2018 showed an EF of 35-45% with thickened and calcified aortic valve leaflets and a mean gradient of 27 mm Hg. The AVA was 0.44 cm2 with a DI of 0.15. He was referred to Dr. Clifton James for another opinion concerning the benefit of TAVR. Cardiac cath on 11/23/2018 showed severe 3-vessel CAD with 3/4 patent grafts. The mean gradient across the aortic valve was 8.6 mm Hg with a peak to peak gradient of 14. CI was 2.0. PA pressure at that time was 75/33 with a mean  of 48 and an LVEDP of 25. His most recent echo on 01/10/2019 showed a mean gradient of 19.8 mm Hg wit a peak of 37.1 mm Hg with an AVA of 0.53 cm2. His LVEF has decreased to 25%.  The patient has been evaluated by the multidisciplinary valve team and felt to have severe, symptomatic aortic stenosis and to be a suitable candidate for TAVR, which was set up for 01/19/19.    Hospital Course     Consultants: none  Severe AS: s/p successful TAVR with a 29 mm Edwards Sapien 3 THV via the left axillary artery approach on 01/22/19. Post operative echo completed but pending formal read. Groin site and chest are stable. ECG with paced rhythm. Continue ASA and resume home Coumadin tonight.   Acute on chronic combined S/D CHF: as evidenced by elevated BNP ~700 on pre admission labwork. This has been treated with TAVR. He has  been resumed on home lasix, lisinopril and coreg. ICD in place.   Atrial fibrillation: his INR is followed at [facility]. I have asked him to arrange for an INR apt early next week for guidance on coumadin dosing.   HTN: initially elevated post op. Now under good control. Resume home meds.   CAD: continue medical therapy   Incidental findings: pre TAVR scan showed the following, which will be followed in the outpatient setting:   Ectatic 2.5 cm infrarenal abdominal aorta, at risk for aneurysm development-->  [place] in 5 years.  Scattered small solid pulmonary nodules in both lungs--> CT 1 year if high risk (former smoker). Finely irregular liver surface, cannot exclude hepatic cirrhosis. Consider hepatic elastography for further liver fibrosis risk stratification, as clinically warranted. _____________  Discharge Vitals Blood pressure 130/72, pulse 69, temperature 98.3 F (36.8 C), temperature source Oral, resp. rate (!) 26, height  (1.676 m), weight 82.8 kg, SpO2 96 %.  Filed Weights   01/22/19 1026 01/23/19 0500  Weight: 82.4 kg 82.8 kg    Labs & Radiologic Studies    CBC Recent Labs    01/22/19 1540 01/23/19 0256  WBC  --  8.8  HGB 12.9* 12.3*  HCT 38.0* 34.9*  MCV  --  107.4*  PLT  --  115*   Basic Metabolic Panel Recent Labs    16/10/96 1423 01/22/19 1540 01/23/19 0256  NA 141 141 138  K 3.9 3.9 3.8  CL  --   --  105  CO2  --   --  23  GLUCOSE 124*  --  180*  BUN  --   --  22  CREATININE  --  1.20 1.23  CALCIUM  --   --  8.7*  MG  --   --  1.8   Liver Function Tests No results for input(s): AST, ALT, ALKPHOS, BILITOT, PROT, ALBUMIN in the last 72 hours. No results for input(s): LIPASE, AMYLASE in the last 72 hours. Cardiac Enzymes No results for input(s): CKTOTAL, CKMB, CKMBINDEX, TROPONINI in the last 72 hours. BNP Invalid input(s): POCBNP D-Dimer No results for input(s): DDIMER in the last 72 hours. Hemoglobin A1C No results for input(s): HGBA1C  in the last 72 hours. Fasting Lipid Panel No results for input(s): CHOL, HDL, LDLCALC, TRIG, CHOLHDL, LDLDIRECT in the last 72 hours. Thyroid Function Tests No results for input(s): TSH, T4TOTAL, T3FREE, THYROIDAB in the last 72 hours.  Invalid input(s): FREET3 _____________  Dg Chest 2 View  Result Date: 01/18/2019 CLINICAL DATA:  Preop, TAVR EXAM: CHEST - 2  VIEW COMPARISON:  None. FINDINGS: Cardiomegaly status post median sternotomy and CABG. Right chest multi lead pacer defibrillator. Mild pulmonary hyperinflation and diaphragmatic flattening without acute abnormality of the lungs. IMPRESSION: Cardiomegaly status post median sternotomy and CABG. Right chest multi lead pacer defibrillator. Mild pulmonary hyperinflation and diaphragmatic flattening without acute abnormality of the lungs. Electronically Signed   By: Lauralyn Primes M.D.   On: 01/18/2019 17:07   Ct Coronary Morph W/cta Cor W/score W/ca W/cm &/or Wo/cm  Addendum Date: 01/10/2019   ADDENDUM REPORT: 01/10/2019 17:30 CLINICAL DATA:  Aortic stenosis EXAM: Cardiac TAVR CT TECHNIQUE: The patient was scanned on a Siemens Force 192 slice scanner. A 120 kV retrospective scan was triggered in the descending thoracic aorta at 111 HU's. Gantry rotation speed was 270 msecs and collimation was .9 mm. No beta blockade or nitro were given. The 3D data set was reconstructed in 5% intervals of the R-R cycle. Systolic and diastolic phases were analyzed on a dedicated work station using MPR, MIP and VRT modes. The patient received 80 cc of contrast. FINDINGS: Aortic Valve: Tri leaflet and calcified with restricted leaflet motion RCC particularly calcified Aorta: Moderate calcific atherosclerosis with no aneurysm Sinotubular Junction: 26 mm There is dense calcification of the STJ especially near the RCA ostium where it is bulky and nodular Ascending Thoracic Aorta: 32 mm Aortic Arch: 31 mm Descending Thoracic Aorta: 26 mm Sinus of Valsalva Measurements:  Non-coronary: 30.3 mm Right - coronary: 28.7 mm Left - coronary: 31.8 mm Coronary Artery Height above Annulus: Left Main: 16.4 mm above annulus Right Coronary: 15.6 mm above annulus Virtual Basal Annulus Measurements: Maximum/Minimum Diameter: 32.5 mm x 22.7 mm Perimeter: 91 mm Area: 614 mm Coronary Arteries: Sufficient height above annulus for deployment but native LM and RCA occluded. Occluded SVG to RCA. Patent LIMA to diagonal, Patent SVG to OM1 and patent SVG to mid LAD Optimum Fluoroscopic Angle for Delivery: LAO 8 Caudal 1 degree IMPRESSION: 1. Calcified tri leaflet AV with annular area of 614 mm2 This is suitable for a 29 mm Sapien 3 valve However the dense nodular STJ calcium and relatively small sinuses may make deployment more difficult 2. Coronary arteries sufficient height above annulus for deployment but native RCA/LM occluded Patent SVG to OM1, SVG to mid LAD Patent LIMA to D1 The native RCA and SVG to RCA are occluded 3. Optimum angiographic angle for deployment LAO 8 degrees Caudal 1 degree 4.  Cannot r/o thrombus in LAA no delayed images performed 5. Degraded images with pacing wire artifact and motion artifact but diagnostic for annular area Charlton Haws Electronically Signed   By: Charlton Haws M.D.   On: 01/10/2019 17:30   Result Date: 01/10/2019 EXAM: OVER-READ INTERPRETATION  CT CHEST The following report is an over-read performed by radiologist Dr. Cleone Slim of [facility] on 01/10/2019. This over-read does not include interpretation of cardiac or coronary anatomy or pathology. The cardiac CTA interpretation by the cardiologist is attached. COMPARISON:  None. FINDINGS: Please see the separate concurrent chest CT angiogram report for details. IMPRESSION: Please see the separate concurrent chest CT angiogram report for details. Electronically Signed: By: Delbert Phenix M.D. On: 01/10/2019 12:54   Dg Chest Port 1 View  Result Date: 01/22/2019 CLINICAL DATA:  Status post TAVR EXAM:  PORTABLE CHEST 1 VIEW COMPARISON:  01/18/2019 FINDINGS: The patient is now status post TAVR. The heart size is enlarged. The patient is status post prior median sternotomy. A right-sided pacemaker/ICD is again noted.  There is mild vascular congestion without overt pulmonary edema there is an old healed fracture of the right clavicle. IMPRESSION: Status post TAVR.  No acute cardiopulmonary process. Electronically Signed   By: Katherine Mantle M.D.   On: 01/22/2019 17:55   Ct Angio Chest Aorta W &/or Wo Contrast  Result Date: 01/10/2019 CLINICAL DATA:  Severe symptomatic aortic stenosis with dyspnea on exertion. TAVR evaluation. EXAM: CT ANGIOGRAPHY CHEST, ABDOMEN AND PELVIS TECHNIQUE: Multidetector CT imaging through the chest, abdomen and pelvis was performed using the standard protocol during bolus administration of intravenous contrast. Multiplanar reconstructed images and MIPs were obtained and reviewed to evaluate the vascular anatomy. CONTRAST:  OMNIPAQUE IOHEXOL 350 MG/ML SOLN COMPARISON:  None. FINDINGS: CTA CHEST FINDINGS Cardiovascular: Moderate cardiomegaly. No significant pericardial effusion/thickening. Three-vessel coronary atherosclerosis status post CABG. Thickened and coarsely calcified aortic valve. Two lead right subclavian ICD is noted with lead tips in the right atrium and right ventricular apex. Atherosclerotic nonaneurysmal thoracic aorta. Normal caliber pulmonary arteries. No central pulmonary emboli. Mediastinum/Nodes: Hypodense bilateral thyroid lobe nodules, largest 0.2 cm on the right. Unremarkable esophagus. No pathologically enlarged axillary, mediastinal or hilar lymph nodes. Lungs/Pleura: No pneumothorax. Trace dependent bilateral pleural effusions. Mild interlobular septal thickening throughout both lungs. Mild patchy ground-glass opacity at the peripheral lung bases, left greater than right. No acute consolidative airspace disease or lung masses. Few scattered small solid  pulmonary nodules in both lungs, largest 4 mm in the posterior left upper lobe (series 16/image 3). Musculoskeletal: No aggressive appearing focal osseous lesions. Intact sternotomy wires. Moderate thoracic spondylosis. CTA ABDOMEN AND PELVIS FINDINGS Hepatobiliary: Finely irregular liver surface, cannot exclude cirrhosis. No liver masses. Cholecystectomy. No biliary ductal dilatation. Pancreas: Normal, with no mass or duct dilation. Spleen: Normal size. No mass. Adrenals/Urinary Tract: Normal adrenals. Bilateral renal cortical scarring, moderate to severe on the left and mild on the right. No renal masses. No hydronephrosis. Normal bladder. Stomach/Bowel: Normal non-distended stomach. Normal caliber small bowel with no small bowel wall thickening. Normal appendix. Mild diffuse colonic diverticulosis, with no large bowel wall thickening or significant pericolonic fat stranding. Vascular/Lymphatic: Atherosclerotic abdominal aorta with ectatic 2.5 cm infrarenal abdominal aorta. No pathologically enlarged lymph nodes in the abdomen or pelvis. Reproductive: Mildly enlarged prostate. Other: No pneumoperitoneum. No focal fluid collection. Small volume and trace perihepatic ascites. Musculoskeletal: No aggressive appearing focal osseous lesions. Marked lumbar spondylosis. Ankylosis at L2-3. VASCULAR MEASUREMENTS PERTINENT TO TAVR: AORTA: Minimal Aortic Diameter-12.0 x 10.0 mm Severity of Aortic Calcification-severe RIGHT PELVIS: Right Common Iliac Artery - Minimal Diameter-7.1 x 5.2 mm Tortuosity-mild Calcification-severe Right External Iliac Artery - Minimal Diameter-6.1 x 6.0 mm Tortuosity-mild-to-moderate Calcification-mild Right Common Femoral Artery - Minimal Diameter-4.8 x 4.1 mm Tortuosity-mild Calcification-severe LEFT PELVIS: Left Common Iliac Artery - Minimal Diameter-8.4 x 4.6 mm Tortuosity-mild Calcification-severe Left External Iliac Artery - Minimal Diameter-7.9 x 7.9 mm Tortuosity-mild Calcification-mild  Left Common Femoral Artery - Minimal Diameter-6.2 x 5.9 mm Tortuosity-mild Calcification-severe Review of the MIP images confirms the above findings. IMPRESSION: 1. Vascular findings and measurements pertinent to potential TAVR procedure, as detailed. 2. Severe thickening and calcification of the aortic valve, compatible with the provided history of severe symptomatic aortic stenosis. 3. Ectatic 2.5 cm infrarenal abdominal aorta, at risk for aneurysm development. Recommend follow-up aortic ultrasound in 5 years. This recommendation follows ACR consensus guidelines: White Paper of the ACR Incidental Findings Committee II on Vascular Findings. J Am Coll Radiol 2013; 16:109-604. 4.  Aortic Atherosclerosis (ICD10-I70.0). 5. Moderate cardiomegaly. Mild interlobular septal  thickening and mild patchy ground-glass opacity in both lungs, favor mild pulmonary edema. Trace dependent bilateral pleural effusions. 6. Scattered small solid pulmonary nodules in both lungs, largest 4 mm. No follow-up needed if patient is low-risk (and has no known or suspected primary neoplasm). Non-contrast chest CT can be considered in 12 months if patient is high-risk. This recommendation follows the consensus statement: Guidelines for Management of Incidental Pulmonary Nodules Detected on CT Images: From the Fleischner Society 2017; Radiology 2017; 284:228-243. 7. Finely irregular liver surface, cannot exclude hepatic cirrhosis. Consider hepatic elastography for further liver fibrosis risk stratification, as clinically warranted. 8. Trace perihepatic and small volume pelvic ascites. 9. Mildly enlarged prostate. 10. Mild colonic diverticulosis. Electronically Signed   By: Delbert Phenix M.D.   On: 01/10/2019 13:36   Vas US Carotid  Result Date: 01/10/2019 Carotid Arterial Duplex Study Indications: Pre TAVR. Performing Technologist: Jeb Levering RDMS, RVT  Examination Guidelines: A complete evaluation includes B-mode imaging, spectral Doppler,  color Doppler, and power Doppler as needed of all accessible portions of each vessel. Bilateral testing is considered an integral part of a complete examination. Limited examinations for reoccurring indications may be performed as noted.  Right Carotid Findings: +----------+--------+--------+--------+------------+---------------------------+             PSV cm/s EDV cm/s Stenosis Describe     Comments                     +----------+--------+--------+--------+------------+---------------------------+  CCA Prox   65       17                heterogenous                              +----------+--------+--------+--------+------------+---------------------------+  CCA Distal 55       11                                                          +----------+--------+--------+--------+------------+---------------------------+  ICA Prox   123      30       1-39%    calcific     higher velocities may be                                                         obscured due to acoustic                                                         shadowing                    +----------+--------+--------+--------+------------+---------------------------+  ICA Distal 60       17                                                          +----------+--------+--------+--------+------------+---------------------------+  ECA        119      4                                                           +----------+--------+--------+--------+------------+---------------------------+ +----------+--------+-------+----------------+-------------------+             PSV cm/s EDV cms Describe         Arm Pressure (mmHG)  +----------+--------+-------+----------------+-------------------+  Subclavian 56               Multiphasic, WNL                      +----------+--------+-------+----------------+-------------------+ +---------+--------+--+--------+--+---------+  Vertebral PSV cm/s 50 EDV cm/s 11 Antegrade   +---------+--------+--+--------+--+---------+ High end of stenosis range.  Left Carotid Findings: +----------+--------+--------+--------+------------+---------------------------+             PSV cm/s EDV cm/s Stenosis Describe     Comments                     +----------+--------+--------+--------+------------+---------------------------+  CCA Prox   50       14                heterogenous                              +----------+--------+--------+--------+------------+---------------------------+  CCA Distal 72       17                                                          +----------+--------+--------+--------+------------+---------------------------+  ICA Prox   92       32       1-39%    calcific     higher velocities may be                                                         obscured due to acoustic                                                         shadowing                    +----------+--------+--------+--------+------------+---------------------------+  ICA Distal 53       12                                                          +----------+--------+--------+--------+------------+---------------------------+  ECA        111      18                                                          +----------+--------+--------+--------+------------+---------------------------+ +----------+--------+--------+----------------+-------------------+  Subclavian PSV cm/s EDV cm/s Describe         Arm Pressure (mmHG)  +----------+--------+--------+----------------+-------------------+             66                Multiphasic, WNL                      +----------+--------+--------+----------------+-------------------+ +---------+--------+--+--------+-+---------+  Vertebral PSV cm/s 34 EDV cm/s 7 Antegrade  +---------+--------+--+--------+-+---------+ High end of stenosis range.  Summary: Right Carotid: Velocities in the right ICA are consistent with a 1-39% stenosis. Left Carotid: Velocities in the left ICA  are consistent with a 1-39% stenosis.  *See table(s) above for measurements and observations.  Electronically signed by Lemar Livings MD on 01/10/2019 at 4:31:31 PM.    Final    Ct Angio Abd/pel W/ And/or W/o  Result Date: 01/10/2019 CLINICAL DATA:  Severe symptomatic aortic stenosis with dyspnea on exertion. TAVR evaluation. EXAM: CT ANGIOGRAPHY CHEST, ABDOMEN AND PELVIS TECHNIQUE: Multidetector CT imaging through the chest, abdomen and pelvis was performed using the standard protocol during bolus administration of intravenous contrast. Multiplanar reconstructed images and MIPs were obtained and reviewed to evaluate the vascular anatomy. CONTRAST:  OMNIPAQUE IOHEXOL 350 MG/ML SOLN COMPARISON:  None. FINDINGS: CTA CHEST FINDINGS Cardiovascular: Moderate cardiomegaly. No significant pericardial effusion/thickening. Three-vessel coronary atherosclerosis status post CABG. Thickened and coarsely calcified aortic valve. Two lead right subclavian ICD is noted with lead tips in the right atrium and right ventricular apex. Atherosclerotic nonaneurysmal thoracic aorta. Normal caliber pulmonary arteries. No central pulmonary emboli. Mediastinum/Nodes: Hypodense bilateral thyroid lobe nodules, largest 0.2 cm on the right. Unremarkable esophagus. No pathologically enlarged axillary, mediastinal or hilar lymph nodes. Lungs/Pleura: No pneumothorax. Trace dependent bilateral pleural effusions. Mild interlobular septal thickening throughout both lungs. Mild patchy ground-glass opacity at the peripheral lung bases, left greater than right. No acute consolidative airspace disease or lung masses. Few scattered small solid pulmonary nodules in both lungs, largest 4 mm in the posterior left upper lobe (series 16/image 3). Musculoskeletal: No aggressive appearing focal osseous lesions. Intact sternotomy wires. Moderate thoracic spondylosis. CTA ABDOMEN AND PELVIS FINDINGS Hepatobiliary: Finely irregular liver surface, cannot  exclude cirrhosis. No liver masses. Cholecystectomy. No biliary ductal dilatation. Pancreas: Normal, with no mass or duct dilation. Spleen: Normal size. No mass. Adrenals/Urinary Tract: Normal adrenals. Bilateral renal cortical scarring, moderate to severe on the left and mild on the right. No renal masses. No hydronephrosis. Normal bladder. Stomach/Bowel: Normal non-distended stomach. Normal caliber small bowel with no small bowel wall thickening. Normal appendix. Mild diffuse colonic diverticulosis, with no large bowel wall thickening or significant pericolonic fat stranding. Vascular/Lymphatic: Atherosclerotic abdominal aorta with ectatic 2.5 cm infrarenal abdominal aorta. No pathologically enlarged lymph nodes in the abdomen or pelvis. Reproductive: Mildly enlarged prostate. Other: No pneumoperitoneum. No focal fluid collection. Small volume and trace perihepatic ascites. Musculoskeletal: No aggressive appearing focal osseous lesions. Marked lumbar spondylosis. Ankylosis at L2-3. VASCULAR MEASUREMENTS PERTINENT TO TAVR: AORTA: Minimal Aortic Diameter-12.0 x 10.0 mm Severity of Aortic Calcification-severe RIGHT PELVIS: Right Common Iliac Artery - Minimal Diameter-7.1 x 5.2 mm Tortuosity-mild Calcification-severe Right External Iliac Artery - Minimal Diameter-6.1 x 6.0 mm Tortuosity-mild-to-moderate Calcification-mild Right Common Femoral Artery - Minimal Diameter-4.8 x 4.1 mm Tortuosity-mild Calcification-severe LEFT PELVIS: Left Common Iliac Artery - Minimal Diameter-8.4 x 4.6 mm Tortuosity-mild Calcification-severe Left External Iliac Artery - Minimal Diameter-7.9 x 7.9 mm Tortuosity-mild Calcification-mild Left Common Femoral Artery - Minimal Diameter-6.2 x 5.9 mm  Tortuosity-mild Calcification-severe Review of the MIP images confirms the above findings. IMPRESSION: 1. Vascular findings and measurements pertinent to potential TAVR procedure, as detailed. 2. Severe thickening and calcification of the aortic  valve, compatible with the provided history of severe symptomatic aortic stenosis. 3. Ectatic 2.5 cm infrarenal abdominal aorta, at risk for aneurysm development. Recommend follow-up aortic ultrasound in 5 years. This recommendation follows ACR consensus guidelines: White Paper of the ACR Incidental Findings Committee II on Vascular Findings. J Am Coll Radiol 2013; 16:109-604. 4.  Aortic Atherosclerosis (ICD10-I70.0). 5. Moderate cardiomegaly. Mild interlobular septal thickening and mild patchy ground-glass opacity in both lungs, favor mild pulmonary edema. Trace dependent bilateral pleural effusions. 6. Scattered small solid pulmonary nodules in both lungs, largest 4 mm. No follow-up needed if patient is low-risk (and has no known or suspected primary neoplasm). Non-contrast chest CT can be considered in 12 months if patient is high-risk. This recommendation follows the consensus statement: Guidelines for Management of Incidental Pulmonary Nodules Detected on CT Images: From the Fleischner Society 2017; Radiology 2017; 284:228-243. 7. Finely irregular liver surface, cannot exclude hepatic cirrhosis. Consider hepatic elastography for further liver fibrosis risk stratification, as clinically warranted. 8. Trace perihepatic and small volume pelvic ascites. 9. Mildly enlarged prostate. 10. Mild colonic diverticulosis. Electronically Signed   By: Delbert Phenix M.D.   On: 01/10/2019 13:36   Disposition   Pt is being discharged home today in good condition.  Follow-up Plans & Appointments    Follow-up Information    Janetta Hora, PA-C. Go on 01/30/2019.   Specialties:  Cardiology, Radiology Why:  @ 12:30 pm. This is a VIRTUAL VISIT. please review information and consent in your discharge paperwork. please have a blood pressure, heart rate and weight recoreded prior to your appointment.  Contact information: [address] [place] [address] [phone]        Coumadin clinic  Follow up.   Why:  Please arrange for a coumadin clinic appointment early next week for guidance of coumadin dosing.            Discharge Medications   Allergies as of 01/23/2019      Reactions   Anesthetics, Ester Other (See Comments)   GI UPSET      Medication List    TAKE these medications   aspirin EC 81 MG tablet Take 81 mg by mouth daily.   brimonidine 0.2 % ophthalmic solution Commonly known as:  ALPHAGAN Place 1 drop into both eyes 2 (two) times a day.   carvedilol 12.5 MG tablet Commonly known as:  COREG Take 12.5 mg by mouth 2 (two) times daily with a meal.   dorzolamide 2 % ophthalmic solution Commonly known as:  TRUSOPT Place 1 drop into both eyes 3 (three) times daily.   famotidine 20 MG tablet Commonly known as:  PEPCID Take 20 mg by mouth daily.   furosemide 40 MG tablet Commonly known as:  LASIX Take 40 mg by mouth daily.   latanoprost 0.005 % ophthalmic solution Commonly known as:  XALATAN Place 1 drop into both eyes at bedtime.   lisinopril 40 MG tablet Commonly known as:  ZESTRIL Take 40 mg by mouth every morning.   magnesium oxide 400 MG tablet Commonly known as:  MAG-OX Take 400 mg by mouth every other day.   multivitamin tablet Take 1 tablet by mouth daily.   nitroGLYCERIN 0.4 MG SL tablet Commonly known as:  NITROSTAT Place 0.4 mg under the tongue every  5 (five) minutes as needed for chest pain.   pravastatin 80 MG tablet Commonly known as:  PRAVACHOL Take 40 mg by mouth at bedtime.   ranolazine 500 MG 12 hr tablet Commonly known as:  RANEXA Take 500 mg by mouth 2 (two) times daily.   Vitamin D 50 MCG (2000 UT) Caps Take 2,000 Units by mouth every other day.   warfarin 5 MG tablet Commonly known as:  COUMADIN Take 2.5-5 mg by mouth See admin instructions. 5 mg M F, 2.5 mg all other days         Outstanding Labs/Studies   INR   Duration of Discharge Encounter   Greater than 30 minutes including physician  time.  Byrd Hesselbach, PA-C 01/23/2019, 11:32 AM [phone]

## 2019-01-24 ENCOUNTER — Telehealth: Payer: Self-pay | Admitting: Physician Assistant

## 2019-01-24 MED FILL — Magnesium Sulfate Inj 50%: INTRAMUSCULAR | Qty: 10 | Status: AC

## 2019-01-24 MED FILL — Potassium Chloride Inj 2 mEq/ML: INTRAVENOUS | Qty: 40 | Status: AC

## 2019-01-24 MED FILL — Heparin Sodium (Porcine) Inj 1000 Unit/ML: INTRAMUSCULAR | Qty: 30 | Status: AC

## 2019-01-24 NOTE — Telephone Encounter (Signed)
  [facility]   MULTIDISCIPLINARY HEART VALVE TEAM   Patient contacted regarding discharge from [facility] on 01/23/19  Patient understands to follow up with provider Carlean Jews on 5/27 as a virtual visit. He also understands he needs his INR checked early next week at his regular coumadin clinic in [place] Patient understands discharge instructions? yes Patient understands medications and regimen? yes Patient understands to bring all medications to this visit? yes  Cline Crock PA-C  [facility]

## 2019-01-30 ENCOUNTER — Telehealth (INDEPENDENT_AMBULATORY_CARE_PROVIDER_SITE_OTHER): Payer: Medicare HMO | Admitting: Physician Assistant

## 2019-01-30 ENCOUNTER — Other Ambulatory Visit: Payer: Self-pay

## 2019-01-30 ENCOUNTER — Encounter: Payer: Self-pay | Admitting: Physician Assistant

## 2019-01-30 ENCOUNTER — Other Ambulatory Visit: Payer: Self-pay | Admitting: Physician Assistant

## 2019-01-30 VITALS — BP 107/72 | HR 73 | Ht 66.0 in | Wt 174.0 lb

## 2019-01-30 DIAGNOSIS — I5043 Acute on chronic combined systolic (congestive) and diastolic (congestive) heart failure: Secondary | ICD-10-CM

## 2019-01-30 DIAGNOSIS — I4891 Unspecified atrial fibrillation: Secondary | ICD-10-CM | POA: Diagnosis not present

## 2019-01-30 DIAGNOSIS — Z952 Presence of prosthetic heart valve: Secondary | ICD-10-CM | POA: Diagnosis not present

## 2019-01-30 DIAGNOSIS — I77811 Abdominal aortic ectasia: Secondary | ICD-10-CM | POA: Diagnosis not present

## 2019-01-30 DIAGNOSIS — I10 Essential (primary) hypertension: Secondary | ICD-10-CM

## 2019-01-30 DIAGNOSIS — R918 Other nonspecific abnormal finding of lung field: Secondary | ICD-10-CM

## 2019-01-30 DIAGNOSIS — I2510 Atherosclerotic heart disease of native coronary artery without angina pectoris: Secondary | ICD-10-CM | POA: Diagnosis not present

## 2019-01-30 MED ORDER — AMOXICILLIN 500 MG PO TABS: ORAL_TABLET | ORAL | 3 refills | Status: AC

## 2019-01-30 NOTE — Progress Notes (Signed)
[facility]ENTER   MULTIDISCIPLINARY HEART VALVE TEAM     Virtual Visit via Video Note   This visit type was conducted due to national recommendations for restrictions regarding the COVID-19 Pandemic (e.g. social distancing) in an effort to limit this patient's exposure and mitigate transmission in our community.  Due to his co-morbid illnesses, this patient is at least at moderate risk for complications without adequate follow up.  This format is felt to be most appropriate for this patient at this time.  All issues noted in this document were discussed and addressed.  A limited physical exam was performed with this format.  Please refer to the patient's chart for his consent to telehealth for [facility].   Evaluation Performed:  Follow-up visit  Date:  01/30/2019   ID:  Derrick Hill, Derrick Hill 1935-07-23, MRN [id number]  Patient Location: Home Provider Location: Office  PCP:  Day, Pernell Dupre, MD  Cardiologist: Dr. Adella Hare / Dr. Clifton James & Dr. Laneta Simmers (TAVR)   Chief Complaint:  Derrick Hill s/p TAVR   History of Present Illness:    Derrick Hill is a 84 y.o. male with a history of HTN, hyperlipidemia, PAF on Coumadin, ischemic cardiomyopathy and chronic systolic heart failure, CAD s/p CABG x 4(LIMA to LAD, SVG to ramus, SVG to Diagonal, SVG to PDA) at [facility], PCI of the OM1 in 2014 with DES, NSVT s/p ICD which had to be removed and replaced in 2016 due to enterococcal bacteremia and severe AS s/p TAVR (01/22/19) who presents for follow up.   The patient does not have symptoms concerning for COVID-19 infection (fever, chills, cough, or new shortness of breath).   Derrick Hill has has known severe AS. He was seen by Dr. Adella Hare at [facility] and underwent TAVR workup in 2015 but did not proceed with TAVR because he was told that it may not improve his symptoms. He has been followed at the [place] in [place] since then. An echo in 04/2018 showed an EF of 35-45% with thickened and  calcified aortic valve leaflets and a mean gradient of 27 mm Hg. The AVA was 0.44 cm2 with a DI of 0.15. He was referred to Dr. Clifton James for another opinion concerning the benefit of TAVR. Cardiac cath on 11/23/2018 showed severe 3-vessel CAD with 3/4 patent grafts. The mean gradient across the aortic valve was 8.6 mm Hg with a peak to peak gradient of 14. CI was 2.0. PA pressure at that time was 75/33 with a mean of 48 and an LVEDP of 25. His most recent echo on 01/10/2019 showed a mean gradient of 19.8 mm Hg with a peak of 37.1 mm Hg with an AVA of 0.53 cm2. His LVEF has decreased to 25%.  He underwent successful TAVR with a73mm Edwards Sapien 3 THV via the left axillary artery approach on 01/22/19. Post operative echo showed EF 30-35% with normally functioning TAVR w/ mean gradient of 5 mm hg and no PVL. He was discharged on home coumadin and aspirin.   Today he presents for follow up. Wife is present for the video chat. He has had some worsening LE edema and has doubled his lasix to 80 mg daily on his own. His LE edema is now much improved. No CP or SOB. No orthopnea or PND. No dizziness or syncope. No blood in stool or urine. No palpitations. He has been out walking in his [place] and feeling much better since having his TAVR with a noticeable difference in his breathing.  Past Medical History:  Diagnosis Date  . AICD (automatic cardioverter/defibrillator) present   . Atrial fibrillation, chronic    s/p AV node ablation   . BPH (benign prostatic hyperplasia)   . Cataract   . Compression of lumbar vertebra (HCC)   . Coronary artery disease   . ED (erectile dysfunction)   . Elevated prostate specific antigen (PSA)   . GERD (gastroesophageal reflux disease)   . Glaucoma primary, open angle    BOTH EYES  . H/O ventricular tachycardia    s/p ICD  . Hepatitis C virus infection   . History of discitis   . Hyperlipidemia   . Hypertension   . Ischemic cardiomyopathy   . Pre-diabetes   .  Retinal detachment   . S/P TAVR (transcatheter aortic valve replacement)   . Severe aortic stenosis   . Systolic CHF, chronic (HCC)    Past Surgical History:  Procedure Laterality Date  . AV NODE ABLATION    . CARDIAC CATHETERIZATION    . CATARACT EXTRACTION    . CHOLECYSTECTOMY    . CORONARY ANGIOPLASTY WITH STENT PLACEMENT    . CORONARY ARTERY BYPASS GRAFT    . INSERT / REPLACE / REMOVE PACEMAKER    . RIGHT/LEFT HEART CATH AND CORONARY/GRAFT ANGIOGRAPHY N/A 11/23/2018   Procedure: RIGHT/LEFT HEART CATH AND CORONARY/GRAFT ANGIOGRAPHY;  Surgeon: Kathleene Hazel, MD;  Location: [facility] INVASIVE CV LAB;  Service: Cardiovascular;  Laterality: N/A;  . SKIN BIOPSY    . TEE WITHOUT CARDIOVERSION N/A 01/22/2019   Procedure: TRANSESOPHAGEAL ECHOCARDIOGRAM (TEE);  Surgeon: Kathleene Hazel, MD;  Location[facility] OR;  Service: Open Heart Surgery;  Laterality: N/A;  . TRANSCATHETER AORTIC VALVE REPLACEMENT, TRANSFEMORAL N/A 01/22/2019   Procedure: LEFT GROIN LINE ACCESS;  Surgeon: Kathleene Hazel, MD;  Location: [facility] OR;  Service: Open Heart Surgery;  Laterality: N/A;  . YAG LASER APPLICATION       Current Meds  Medication Sig  . amoxicillin (AMOXIL) 500 MG tablet Take 4 tablets by mouth 60 minutes prior to dental appointments  . aspirin EC 81 MG tablet Take 81 mg by mouth daily.  . brimonidine (ALPHAGAN) 0.2 % ophthalmic solution Place 1 drop into both eyes 2 (two) times a day.   . carvedilol (COREG) 12.5 MG tablet Take 12.5 mg by mouth 2 (two) times daily with a meal.   . Cholecalciferol (VITAMIN D) 50 MCG (2000 UT) CAPS Take 2,000 Units by mouth every other day.   . dorzolamide (TRUSOPT) 2 % ophthalmic solution Place 1 drop into both eyes 3 (three) times daily.  . famotidine (PEPCID) 20 MG tablet Take 20 mg by mouth daily.  . furosemide (LASIX) 40 MG tablet Take 80 mg by mouth daily.   Marland Kitchen latanoprost (XALATAN) 0.005 % ophthalmic solution Place 1 drop into both eyes at bedtime.  Marland Kitchen  lisinopril (PRINIVIL,ZESTRIL) 40 MG tablet Take 40 mg by mouth every morning.   . magnesium oxide (MAG-OX) 400 MG tablet Take 400 mg by mouth every other day.  . Multiple Vitamin (MULTIVITAMIN) tablet Take 1 tablet by mouth daily.  . nitroGLYCERIN (NITROSTAT) 0.4 MG SL tablet Place 0.4 mg under the tongue every 5 (five) minutes as needed for chest pain.  . pravastatin (PRAVACHOL) 80 MG tablet Take 40 mg by mouth at bedtime.   . ranolazine (RANEXA) 500 MG 12 hr tablet Take 500 mg by mouth 2 (two) times daily.  Marland Kitchen warfarin (COUMADIN) 5 MG tablet Take 2.5-5 mg by mouth See admin  instructions. 5 mg M F, 2.5 mg all other days     Allergies:   Anesthetics, ester   Social History   Tobacco Use  . Smoking status: Former Smoker    Packs/day: 2.00    Years: 40.00    Pack years: 80.00    Types: Cigarettes    Last attempt to quit: 09/06/1983    Years since quitting: 35.4  . Smokeless tobacco: Never Used  Substance Use Topics  . Alcohol use: Yes    Alcohol/week: 2.0 standard drinks    Types: 1 Glasses of wine, 1 Cans of beer per week  . Drug use: Not on file     Family Hx: The patient's family history includes Cancer - Other in his sister.  ROS:   Please see the history of present illness.    All other systems reviewed and are negative.   Prior CV studies:   The following studies were reviewed today:  TAVR OPERATIVE NOTE   Date of Procedure:01/22/2019  Preoperative Diagnosis:Severe Aortic Stenosis   Postoperative Diagnosis:Same   Procedure:  Transcatheter Aortic Valve Replacement -Left axillary artery approach  Edwards Sapien 3 THV (size 6mm, model # 9600TFX, serial # [id number])  Co-Surgeons:Bryan Jennefer Bravo, MD and Verne Carrow, MD  Anesthesiologist:Kevin Hart Rochester, MD  Echocardiographer:Peter Eden Emms, MD  Pre-operative Echo Findings: ? Severe aortic stenosis ?  Severeleft ventricular systolic dysfunction  Post-operative Echo Findings: ? trivialparavalvular leak ? Unchanged severeleft ventricular systolic dysfunction _____________   Echo 01/23/19: IMPRESSIONS  1. The left ventricle has moderate-severely reduced systolic function, with an ejection fraction of 30-35%. There is mildly increased left ventricular wall thickness. Left ventricular diastolic Doppler parameters are consistent with restrictive filling. Elevated mean left atrial pressure Left ventricular diffuse hypokinesis.  2. Hypokinesis worse in the inferior and inferoseptal myocardium.  3. No evidence of mitral valve stenosis.  4. No stenosis of the aortic valve.  5. The ascending aorta is normal in size and structure.  6. - TAVR: 29 mm Edwards Sapien 3 TAVR bioprosthesis present and functioning properly. Peak velocity 1.7 m/s. Mean gradient 5 mmHg.   Labs/Other Tests and Data Reviewed:    EKG:  No ECG reviewed.  Recent Labs: 01/18/2019: ALT 23; B Natriuretic Peptide 682.2 01/23/2019: BUN 22; Creatinine, Ser 1.23; Hemoglobin 12.3; Magnesium 1.8; Platelets 115; Potassium 3.8; Sodium 138   Recent Lipid Panel No results found for: CHOL, TRIG, HDL, CHOLHDL, LDLCALC, LDLDIRECT  Wt Readings from Last 3 Encounters:  01/30/19 174 lb (78.9 kg)  01/23/19 182 lb 8.7 oz (82.8 kg)  01/18/19 181 lb 11.2 oz (82.4 kg)     Objective:    Vital Signs:  BP 107/72 (BP Location: Left Arm, Patient Position: Sitting, Cuff Size: Normal)   Pulse 73   Ht 5\' 6"  (1.676 m)   Wt 174 lb (78.9 kg)   BMI 28.08 kg/m    Well nourished, well developed male in no acute distress. Trace LE edema. Chest wall site healing well.    ASSESSMENT & PLAN:    Severe AS s/p TAVR: doing well. Breathing has improved since having TAVR. Groin site and chest wall site healing well. SBE prophylaxis discussed; I have RX'd amoxicillin. Plan for 1 month echo in June followed by a virtual visit.   Acute on chronic  combined S/D CHF: he self doubled his lasix to 80mg  daily. Weight is now back to baseline. They were reluctant to go back to 40mg  daily. I will check a BMET and adjust lasix accordingly. He  said he has an INR check tomorrow at PCP office. I will see if they will add a BMET to his visit to minimize exposure given covid 19 pandemic.   Atrial fibrillation: continue coumadin. INR followed at Dr. Coralee North Day's office.   HTN: BP well controlled.   CAD: continue medical therapy.   VT s/p ICD: [facility]  Incidental findings: pre TAVR scan showed the following, which will be followed in the outpatient setting:    Ectatic 2.5 cm infrarenal abdominal aorta, at risk for aneurysm development-->  [place] in 5 years.   Scattered small solid pulmonary nodules in both lungs--> CT 1 year if high risk (former smoker).  Finely irregular liver surface, cannot exclude hepatic cirrhosis. Consider hepatic elastography for further liver fibrosis risk stratification, as clinically warranted.   COVID-19 Education: The signs and symptoms of COVID-19 were discussed with the patient and how to seek care for testing (follow up with PCP or arrange E-visit).  The importance of social distancing was discussed today.  Time:   Today, I have spent 25 minutes with the patient with telehealth technology discussing the above problems.     Medication Adjustments/Labs and Tests Ordered: Current medicines are reviewed at length with the patient today.  Concerns regarding medicines are outlined above.   Tests Ordered: No orders of the defined types were placed in this encounter.   Medication Changes: Meds ordered this encounter  Medications  . amoxicillin (AMOXIL) 500 MG tablet    Sig: Take 4 tablets by mouth 60 minutes prior to dental appointments    Dispense:  4 tablet    Refill:  3    Please keep on file.  Pt will call when he needs filled.    Disposition:  Follow up in 1 month(s)  Signed, Cline Crock, PA-C  01/30/2019 1:13 PM    [facility]

## 2019-01-30 NOTE — Progress Notes (Signed)
Awesome!

## 2019-01-30 NOTE — Patient Instructions (Signed)
Medication Instructions:  Your physician recommends that you continue on your current medications as directed. Please refer to the Current Medication list given to you today.  If you need a refill on your cardiac medications before your next appointment, please call your pharmacy.   Lab work: Have lab work checked tomorrow (BMP) when you have coumadin check.  I have called Dr. Nelly Laurence office and they will draw this lab work.  If you have labs (blood work) drawn today and your tests are completely normal, you will receive your results only by: Marland Kitchen MyChart Message (if you have MyChart) OR . A paper copy in the mail If you have any lab test that is abnormal or we need to change your treatment, we will call you to review the results.  Testing/Procedures: Your physician has requested that you have an echocardiogram. Echocardiography is a painless test that uses sound waves to create images of your heart. It provides your doctor with information about the size and shape of your heart and how well your heart's chambers and valves are working. This procedure takes approximately one hour. There are no restrictions for this procedure.  Scheduled for June 9,2020.  Follow-Up:  Virtual office visit with K. Janee Morn, [place] on June 10,2020.  Any Other Special Instructions Will Be Listed Below (If Applicable). Your physician discussed the importance of taking an antibiotic prior to any dental, gastrointestinal, genitourinary procedures to prevent damage to the heart valves from infection. You were given a prescription for an antibiotic based on current SBE prophylaxis guidelines.--Prescription was sent to [facility].

## 2019-01-31 DIAGNOSIS — I350 Nonrheumatic aortic (valve) stenosis: Secondary | ICD-10-CM | POA: Diagnosis not present

## 2019-01-31 DIAGNOSIS — I4891 Unspecified atrial fibrillation: Secondary | ICD-10-CM | POA: Diagnosis not present

## 2019-01-31 DIAGNOSIS — Z7901 Long term (current) use of anticoagulants: Secondary | ICD-10-CM | POA: Diagnosis not present

## 2019-01-31 DIAGNOSIS — Z5181 Encounter for therapeutic drug level monitoring: Secondary | ICD-10-CM | POA: Diagnosis not present

## 2019-02-11 ENCOUNTER — Telehealth (HOSPITAL_COMMUNITY): Payer: Self-pay | Admitting: *Deleted

## 2019-02-11 DIAGNOSIS — Z7901 Long term (current) use of anticoagulants: Secondary | ICD-10-CM | POA: Diagnosis not present

## 2019-02-11 DIAGNOSIS — Z5181 Encounter for therapeutic drug level monitoring: Secondary | ICD-10-CM | POA: Diagnosis not present

## 2019-02-11 DIAGNOSIS — I4891 Unspecified atrial fibrillation: Secondary | ICD-10-CM | POA: Diagnosis not present

## 2019-02-11 NOTE — Telephone Encounter (Signed)
COVID-19 Pre-Screening Questions:  . Do you currently have a fever? No (yes = cancel and refer to pcp for e-visit) . Have you recently travelled on a cruise, internationally, or to [place], [place], [place], [place], [place], or [place], [place] [employer]) ? No (yes = cancel, stay home, monitor symptoms, and contact pcp or initiate e-visit if symptoms develop) . Have you been in contact with someone that is currently pending confirmation of Covid19 testing or has been confirmed to have the [place] virus?  NO (yes = cancel, stay home, away from tested individual, monitor symptoms, and contact pcp or initiate e-visit if symptoms develop) . Are you currently experiencing fatigue or cough? No (yes = pt should be prepared to have a mask placed at the time of their visit).   . Reiterated no additional visitors. Eartha Inch no earlier than 15 minutes before appointment time. . Please bring own mask.  Gissel Keilman

## 2019-02-11 NOTE — Telephone Encounter (Signed)
  Left VM to call back   Derrick Hill 

## 2019-02-12 ENCOUNTER — Other Ambulatory Visit: Payer: Self-pay

## 2019-02-12 ENCOUNTER — Other Ambulatory Visit: Payer: No Typology Code available for payment source

## 2019-02-12 ENCOUNTER — Other Ambulatory Visit: Payer: Self-pay | Admitting: Physician Assistant

## 2019-02-12 ENCOUNTER — Ambulatory Visit (HOSPITAL_COMMUNITY): Payer: No Typology Code available for payment source | Attending: Cardiovascular Disease

## 2019-02-12 DIAGNOSIS — Z952 Presence of prosthetic heart valve: Secondary | ICD-10-CM | POA: Insufficient documentation

## 2019-02-12 DIAGNOSIS — I5032 Chronic diastolic (congestive) heart failure: Secondary | ICD-10-CM

## 2019-02-12 NOTE — Progress Notes (Addendum)
[facility]   MULTIDISCIPLINARY HEART VALVE TEAM     Virtual Visit via Video Note   This visit type was conducted due to national recommendations for restrictions regarding the COVID-19 Pandemic (e.g. social distancing) in an effort to limit this patient's exposure and mitigate transmission in our community.  Due to his co-morbid illnesses, this patient is at least at moderate risk for complications without adequate follow up.  This format is felt to be most appropriate for this patient at this time.  All issues noted in this document were discussed and addressed.  A limited physical exam was performed with this format.  Please refer to the patient's chart for his consent to telehealth for [facility].   Evaluation Performed:  Follow-up visit  Date:  02/13/2019   ID:  Derrick Hill, DOB December 07, 1934, MRN [id number]  Patient Location: Home Provider Location: Office  PCP:  Day, Pernell Dupre, MD  Cardiologist: Dr. Andee Poles [place], [place])/ Dr. Clifton James & Dr. Laneta Simmers (TAVR)  Chief Complaint:  1 month s/p TAVR  History of Present Illness:    Derrick Hill is a 84 y.o. male with a history of HTN, hyperlipidemia, PAF on Coumadin,ischemic cardiomyopathy and chronic systolic heart failure, CADs/p CABG x 4(LIMA to LAD, SVG to ramus, SVG to Diagonal, SVG to PDA) at [facility], PCI of the OM1 in 2014 with DES, NSVT s/p ICD which had to be removed and replaced in 2016 due to enterococcal bacteremia and severe AS s/p TAVR (01/22/19) who presents for follow up.   The patient does not have symptoms concerning for COVID-19 infection (fever, chills, cough, or new shortness of breath).   Derrick Hill has has known severe AS. He was seen by Dr. Adella Hare at [facility] and underwent TAVR workup in 2015 but did not proceed with TAVR because he was told that it may not improve his symptoms. He has been followed at the [place] in [place] since then. An echo in 04/2018 showed an EF of 35-45% with  thickened and calcified aortic valve leaflets and a mean gradient of 27 mm Hg. The AVA was 0.44 cm2 with a DI of 0.15. He was referred to Dr. Clifton James for another opinion concerning the benefit of TAVR. Cardiac cath on 11/23/2018 showed severe 3-vessel CAD with 3/4 patent grafts. The mean gradient across the aortic valve was 8.6 mm Hg with a peak to peak gradient of 14. CI was 2.0. PA pressure at that time was 75/33 with a mean of 48 and an LVEDP of 25. His most recent echo on 01/10/2019 showed a mean gradient of 19.8 mm Hg with a peak of 37.1 mm Hg with an AVA of 0.53 cm2. His LVEF has decreased to 25%.  He underwent successful TAVR with a39mm Edwards Sapien 3 THV via theleft axillary arteryapproach on 01/22/19. Post operative echo showed EF 30-35% with normally functioning TAVR w/ mean gradient of 5 mm hg and no PVL. He was discharged on home coumadin and aspirin.   He had some worsening LE edema and self increased his lasix to 80mg  daily. He then went back down to 40mg  daily. BMP at Dr. Nelly Laurence office showed creat 1.48, K 4.2, NA 141.  Today he presents for follow up. He is doing great. He feels so much better. He is mostly limited by back pain. No CP or SOB. No LE edema, orthopnea or PND. No dizziness or syncope. No blood in stool or urine. No palpitations. He walks around his [place] with no  issues and no longer has to stop. He occasionally has fatigue.    Past Medical History:  Diagnosis Date   AICD (automatic cardioverter/defibrillator) present    Atrial fibrillation, chronic    s/p AV node ablation    BPH (benign prostatic hyperplasia)    Cataract    Compression of lumbar vertebra (HCC)    Coronary artery disease    ED (erectile dysfunction)    Elevated prostate specific antigen (PSA)    GERD (gastroesophageal reflux disease)    Glaucoma primary, open angle    BOTH EYES   H/O ventricular tachycardia    s/p ICD   Hepatitis C virus infection    History of discitis     Hyperlipidemia    Hypertension    Ischemic cardiomyopathy    Pre-diabetes    Retinal detachment    S/P TAVR (transcatheter aortic valve replacement)    Severe aortic stenosis    Systolic CHF, chronic (HCC)    Past Surgical History:  Procedure Laterality Date   AV NODE ABLATION     CARDIAC CATHETERIZATION     CATARACT EXTRACTION     CHOLECYSTECTOMY     CORONARY ANGIOPLASTY WITH STENT PLACEMENT     CORONARY ARTERY BYPASS GRAFT     INSERT / REPLACE / REMOVE PACEMAKER     RIGHT/LEFT HEART CATH AND CORONARY/GRAFT ANGIOGRAPHY N/A 11/23/2018   Procedure: RIGHT/LEFT HEART CATH AND CORONARY/GRAFT ANGIOGRAPHY;  Surgeon: Burnell Blanks, MD;  Location: [place] [facility];  Service: Cardiovascular;  Laterality: N/A;   SKIN BIOPSY     TEE WITHOUT CARDIOVERSION N/A 01/22/2019   Procedure: TRANSESOPHAGEAL ECHOCARDIOGRAM (TEE);  Surgeon: Burnell Blanks, MD;  Location: [place];  Service: Open Heart Surgery;  Laterality: N/A;   TRANSCATHETER AORTIC VALVE REPLACEMENT, TRANSFEMORAL N/A 01/22/2019   Procedure: LEFT GROIN LINE ACCESS;  Surgeon: Burnell Blanks, MD;  Location: [place];  Service: Open Heart Surgery;  Laterality: N/A;   YAG LASER APPLICATION       Current Meds  Medication Sig   amoxicillin (AMOXIL) 500 MG tablet Take 4 tablets by mouth 60 minutes prior to dental appointments   aspirin EC 81 MG tablet Take 81 mg by mouth daily.   brimonidine (ALPHAGAN) 0.2 % ophthalmic solution Place 1 drop into both eyes 2 (two) times a day.    carvedilol (COREG) 12.5 MG tablet Take 12.5 mg by mouth 2 (two) times daily with a meal.    Cholecalciferol (VITAMIN D) 50 MCG (2000 UT) CAPS Take 2,000 Units by mouth every other day.    dorzolamide (TRUSOPT) 2 % ophthalmic solution Place 1 drop into both eyes 3 (three) times daily.   furosemide (LASIX) 40 MG tablet Take 40 mg by mouth daily.    latanoprost (XALATAN) 0.005 % ophthalmic solution Place 1 drop into both  eyes at bedtime.   lisinopril (PRINIVIL,ZESTRIL) 40 MG tablet Take 40 mg by mouth every morning.    magnesium oxide (MAG-OX) 400 MG tablet Take 400 mg by mouth every other day.   Multiple Vitamin (MULTIVITAMIN) tablet Take 1 tablet by mouth daily.   nitroGLYCERIN (NITROSTAT) 0.4 MG SL tablet Place 0.4 mg under the tongue every 5 (five) minutes as needed for chest pain.   pravastatin (PRAVACHOL) 80 MG tablet Take 40 mg by mouth at bedtime.    ranolazine (RANEXA) 500 MG 12 hr tablet Take 500 mg by mouth 2 (two) times daily.   warfarin (COUMADIN) 5 MG tablet Take 2.5-5 mg by mouth See  admin instructions. 5 mg M F, 2.5 mg all other days     Allergies:   Anesthetics, ester   Social History   Tobacco Use   Smoking status: Former Smoker    Packs/day: 2.00    Years: 40.00    Pack years: 80.00    Types: Cigarettes    Last attempt to quit: 09/06/1983    Years since quitting: 35.4   Smokeless tobacco: Never Used  Substance Use Topics   Alcohol use: Yes    Alcohol/week: 2.0 standard drinks    Types: 1 Glasses of wine, 1 Cans of beer per week   Drug use: Not on file     Family Hx: The patient's family history includes Cancer - Other in his sister.  ROS:   Please see the history of present illness.    All other systems reviewed and are negative.   Prior CV studies:   The following studies were reviewed today:  TAVR OPERATIVE NOTE   Date of Procedure:01/22/2019  Preoperative Diagnosis:Severe Aortic Stenosis   Postoperative Diagnosis:Same   Procedure:  Transcatheter Aortic Valve Replacement -Left axillary artery approach  Edwards Sapien 3 THV (size 29mm, model # 9600TFX, serial # [id number])  Co-Surgeons:Bryan Jennefer BravoK. Bartle, MD and Verne Carrowhristopher McAlhany, MD  Anesthesiologist:Kevin Hart RochesterHollis, MD  Echocardiographer:Peter Eden EmmsNishan, MD  Pre-operative Echo Findings: ? Severe aortic  stenosis ? Severeleft ventricular systolic dysfunction  Post-operative Echo Findings: ? trivialparavalvular leak ? Unchanged severeleft ventricular systolic dysfunction  _____________   Echo 01/23/19: IMPRESSIONS 1. The left ventricle has moderate-severely reduced systolic function, with an ejection fraction of 30-35%. There is mildly increased left ventricular wall thickness. Left ventricular diastolic Doppler parameters are consistent with restrictive filling. Elevated mean left atrial pressure Left ventricular diffuse hypokinesis. 2. Hypokinesis worse in the inferior and inferoseptal myocardium. 3. No evidence of mitral valve stenosis. 4. No stenosis of the aortic valve. 5. The ascending aorta is normal in size and structure. 6. - TAVR: 29 mm Edwards Sapien 3 TAVR bioprosthesis present and functioning properly. Peak velocity 1.7 m/s. Mean gradient 5 mmHg.  ______________  Echo 02/12/19 IMPRESSIONS  1. The left ventricle has mild-moderately reduced systolic function, with an ejection fraction of 40-45%. The cavity size was normal. There is moderate concentric left ventricular hypertrophy. Left ventricular diastolic function could not be evaluated  secondary to atrial fibrillation. There is abnormal septal motion consistent with RV pacemaker. Left ventricular diffuse hypokinesis.  2. The right ventricle has mildly reduced systolic function. The cavity was mildly enlarged. There is no increase in right ventricular wall thickness. Right ventricular systolic pressure is mildly elevated with an estimated pressure of 52.8 mmHg.  3. Left atrial size was severely dilated.  4. Right atrial size was severely dilated.  5. Mild thickening of the mitral valve leaflet. There is mild mitral annular calcification present. Mild Derrick. The Derrick jet is centrally-directed.  6. A 29mm an Carolin GuernseyEdwards Edwards Sapien bioprosthetic aortic valve (TAVR) valve is present in the aortic position. Peak and mean  gradients 12 and 6 mm Hg, not significantly changed from 01/23/2019.  7. Trivial TAVR perivalvular leak.  8. The ascending aorta is normal in size and structure.   Labs/Other Tests and Data Reviewed:    EKG:  No ECG reviewed.  Recent Labs: 01/18/2019: ALT 23; B Natriuretic Peptide 682.2 01/23/2019: BUN 22; Creatinine, Ser 1.23; Hemoglobin 12.3; Magnesium 1.8; Platelets 115; Potassium 3.8; Sodium 138   Recent Lipid Panel No results found for: CHOL, TRIG, HDL, CHOLHDL, LDLCALC, LDLDIRECT  Wt Readings from Last 3 Encounters:  02/13/19 175 lb (79.4 kg)  01/30/19 174 lb (78.9 kg)  01/23/19 182 lb 8.7 oz (82.8 kg)     Objective:    Vital Signs:  BP 117/81    Pulse 70    Wt 175 lb (79.4 kg)    BMI 28.25 kg/m    Well nourished, well developed male in no acute distress.   ASSESSMENT & PLAN:    Severe AS s/p TAVR: doing great with NYHA class II symptoms. His activity is mostly limited by back pain. Echo today showed improvement in EF to 40-45%, normally functioning TAVR with mean gradient 6 mm Hg and trivial PVL. Continue aspirin and coumadin. I told him he could stop aspirin after 6 months, but he was previously on aspirin and coumadin so will continue this at the discretion of his primary cardiologist[place] I will see him back in 1 year with echo, CT and follow up.   Chroniccombined S/DCHF: s/p ICD. EF improved to 40-45%. No s/s CHF. Continue lasix 40mg  daily.   Chronic atrial fibrillation: rate well controlled. Continue coumadin. INR followed by Dr. Morrie Sheldonay   HTN: BP well controlled today. Continue current regimen   CAD: continue medical therapy.   Incidental findings: pre TAVR scan showed the following, which will be followed in the outpatient setting:   Ectatic 2.5 cm infrarenal abdominal aorta, at risk for aneurysm development-->US in 5 years.   Scattered small solid pulmonary nodules in both lungs-->CT 1 year if high risk (former  smoker). This will be arranged at our 1 year office visit   Finely irregular liver surface, cannot exclude hepatic cirrhosis. Consider hepatic elastography for further liver fibrosis risk stratification, as clinically warranted. Of note, his bilirubin was elevated to 2.6 and AST 62. Will defer to primary care to see if any further work up is warranted.   COVID-19 Education: The signs and symptoms of COVID-19 were discussed with the patient and how to seek care for testing (follow up with PCP or arrange E-visit).  The importance of social distancing was discussed today.  Time:   Today, I have spent  15 minutes with the patient with telehealth technology discussing the above problems.     Medication Adjustments/Labs and Tests Ordered: Current medicines are reviewed at length with the patient today.  Concerns regarding medicines are outlined above.   Tests Ordered: Orders Placed This Encounter  Procedures   CT Chest Wo Contrast   ECHOCARDIOGRAM COMPLETE    Medication Changes: No orders of the defined types were placed in this encounter.   Disposition:  Follow up in 1 year(s)  Signed, Cline CrockKathryn Ludella Pranger, PA-C  02/13/2019 2:08 PM    [facility]

## 2019-02-13 ENCOUNTER — Telehealth (INDEPENDENT_AMBULATORY_CARE_PROVIDER_SITE_OTHER): Payer: Medicare HMO | Admitting: Physician Assistant

## 2019-02-13 ENCOUNTER — Telehealth: Payer: Non-veteran care | Admitting: Physician Assistant

## 2019-02-13 VITALS — BP 117/81 | HR 70 | Wt 175.0 lb

## 2019-02-13 DIAGNOSIS — I10 Essential (primary) hypertension: Secondary | ICD-10-CM

## 2019-02-13 DIAGNOSIS — R918 Other nonspecific abnormal finding of lung field: Secondary | ICD-10-CM

## 2019-02-13 DIAGNOSIS — Z952 Presence of prosthetic heart valve: Secondary | ICD-10-CM | POA: Diagnosis not present

## 2019-02-13 DIAGNOSIS — I2510 Atherosclerotic heart disease of native coronary artery without angina pectoris: Secondary | ICD-10-CM

## 2019-02-13 DIAGNOSIS — I4891 Unspecified atrial fibrillation: Secondary | ICD-10-CM

## 2019-02-13 DIAGNOSIS — I5032 Chronic diastolic (congestive) heart failure: Secondary | ICD-10-CM

## 2019-02-13 NOTE — Patient Instructions (Addendum)
Medication Instructions:  Your provider recommends that you continue on your current medications as directed. Please refer to the Current Medication list given to you today.    Testing/Procedures: Joellen Jersey recommends you have a CT when you come for your 1 year TAVR appointments.   Follow-Up: You are scheduled for your 1 year TAVR appointments (echo, CT, and office visit with Nell Range), on Thursday, Jan 23, 2020. Please arrive to our office at 12:45PM.

## 2019-02-22 ENCOUNTER — Encounter: Payer: Self-pay | Admitting: Thoracic Surgery (Cardiothoracic Vascular Surgery)

## 2019-02-25 DIAGNOSIS — Z7901 Long term (current) use of anticoagulants: Secondary | ICD-10-CM | POA: Diagnosis not present

## 2019-02-25 DIAGNOSIS — Z5181 Encounter for therapeutic drug level monitoring: Secondary | ICD-10-CM | POA: Diagnosis not present

## 2019-02-25 DIAGNOSIS — I4891 Unspecified atrial fibrillation: Secondary | ICD-10-CM | POA: Diagnosis not present

## 2019-03-19 DIAGNOSIS — R945 Abnormal results of liver function studies: Secondary | ICD-10-CM | POA: Diagnosis not present

## 2019-03-19 DIAGNOSIS — Z7901 Long term (current) use of anticoagulants: Secondary | ICD-10-CM | POA: Diagnosis not present

## 2019-03-19 DIAGNOSIS — I4891 Unspecified atrial fibrillation: Secondary | ICD-10-CM | POA: Diagnosis not present

## 2019-03-19 DIAGNOSIS — I509 Heart failure, unspecified: Secondary | ICD-10-CM | POA: Diagnosis not present

## 2019-03-19 DIAGNOSIS — R413 Other amnesia: Secondary | ICD-10-CM | POA: Diagnosis not present

## 2019-03-19 DIAGNOSIS — Z952 Presence of prosthetic heart valve: Secondary | ICD-10-CM | POA: Diagnosis not present

## 2019-03-19 DIAGNOSIS — E785 Hyperlipidemia, unspecified: Secondary | ICD-10-CM | POA: Diagnosis not present

## 2019-03-19 DIAGNOSIS — Z8619 Personal history of other infectious and parasitic diseases: Secondary | ICD-10-CM | POA: Diagnosis not present

## 2019-03-19 DIAGNOSIS — N289 Disorder of kidney and ureter, unspecified: Secondary | ICD-10-CM | POA: Diagnosis not present

## 2019-03-19 DIAGNOSIS — I350 Nonrheumatic aortic (valve) stenosis: Secondary | ICD-10-CM | POA: Diagnosis not present

## 2019-03-19 DIAGNOSIS — I2510 Atherosclerotic heart disease of native coronary artery without angina pectoris: Secondary | ICD-10-CM | POA: Diagnosis not present

## 2019-04-05 DIAGNOSIS — R945 Abnormal results of liver function studies: Secondary | ICD-10-CM | POA: Diagnosis not present

## 2019-04-05 DIAGNOSIS — R413 Other amnesia: Secondary | ICD-10-CM | POA: Diagnosis not present

## 2019-04-11 DIAGNOSIS — I472 Ventricular tachycardia: Secondary | ICD-10-CM | POA: Diagnosis not present

## 2019-04-19 DIAGNOSIS — I4891 Unspecified atrial fibrillation: Secondary | ICD-10-CM | POA: Diagnosis not present

## 2019-04-19 DIAGNOSIS — Z7901 Long term (current) use of anticoagulants: Secondary | ICD-10-CM | POA: Diagnosis not present

## 2019-04-19 DIAGNOSIS — Z5181 Encounter for therapeutic drug level monitoring: Secondary | ICD-10-CM | POA: Diagnosis not present

## 2019-05-17 DIAGNOSIS — Z5181 Encounter for therapeutic drug level monitoring: Secondary | ICD-10-CM | POA: Diagnosis not present

## 2019-05-17 DIAGNOSIS — Z7901 Long term (current) use of anticoagulants: Secondary | ICD-10-CM | POA: Diagnosis not present

## 2019-05-17 DIAGNOSIS — I4891 Unspecified atrial fibrillation: Secondary | ICD-10-CM | POA: Diagnosis not present

## 2019-05-20 DIAGNOSIS — R69 Illness, unspecified: Secondary | ICD-10-CM | POA: Diagnosis not present

## 2019-06-07 DIAGNOSIS — I4891 Unspecified atrial fibrillation: Secondary | ICD-10-CM | POA: Diagnosis not present

## 2019-06-07 DIAGNOSIS — Z5181 Encounter for therapeutic drug level monitoring: Secondary | ICD-10-CM | POA: Diagnosis not present

## 2019-06-07 DIAGNOSIS — Z7901 Long term (current) use of anticoagulants: Secondary | ICD-10-CM | POA: Diagnosis not present

## 2019-07-05 DIAGNOSIS — Z5181 Encounter for therapeutic drug level monitoring: Secondary | ICD-10-CM | POA: Diagnosis not present

## 2019-07-05 DIAGNOSIS — I480 Paroxysmal atrial fibrillation: Secondary | ICD-10-CM | POA: Diagnosis not present

## 2019-07-05 DIAGNOSIS — Z7901 Long term (current) use of anticoagulants: Secondary | ICD-10-CM | POA: Diagnosis not present

## 2019-07-10 DIAGNOSIS — I255 Ischemic cardiomyopathy: Secondary | ICD-10-CM | POA: Diagnosis not present

## 2019-07-10 DIAGNOSIS — Z4502 Encounter for adjustment and management of automatic implantable cardiac defibrillator: Secondary | ICD-10-CM | POA: Diagnosis not present

## 2019-07-10 DIAGNOSIS — Z95810 Presence of automatic (implantable) cardiac defibrillator: Secondary | ICD-10-CM | POA: Diagnosis not present

## 2019-07-25 DIAGNOSIS — I493 Ventricular premature depolarization: Secondary | ICD-10-CM | POA: Diagnosis not present

## 2019-07-25 DIAGNOSIS — I517 Cardiomegaly: Secondary | ICD-10-CM | POA: Diagnosis not present

## 2019-07-25 DIAGNOSIS — I4891 Unspecified atrial fibrillation: Secondary | ICD-10-CM | POA: Diagnosis not present

## 2019-07-25 DIAGNOSIS — R0600 Dyspnea, unspecified: Secondary | ICD-10-CM | POA: Diagnosis not present

## 2019-07-25 DIAGNOSIS — Z950 Presence of cardiac pacemaker: Secondary | ICD-10-CM | POA: Diagnosis not present

## 2019-07-25 DIAGNOSIS — T827XXD Infection and inflammatory reaction due to other cardiac and vascular devices, implants and grafts, subsequent encounter: Secondary | ICD-10-CM | POA: Diagnosis not present

## 2019-07-25 DIAGNOSIS — I498 Other specified cardiac arrhythmias: Secondary | ICD-10-CM | POA: Diagnosis not present

## 2019-07-25 DIAGNOSIS — I350 Nonrheumatic aortic (valve) stenosis: Secondary | ICD-10-CM | POA: Diagnosis not present

## 2019-07-25 DIAGNOSIS — I495 Sick sinus syndrome: Secondary | ICD-10-CM | POA: Diagnosis not present

## 2019-08-05 DIAGNOSIS — I4891 Unspecified atrial fibrillation: Secondary | ICD-10-CM | POA: Diagnosis not present

## 2019-08-05 DIAGNOSIS — Z7901 Long term (current) use of anticoagulants: Secondary | ICD-10-CM | POA: Diagnosis not present

## 2019-08-05 DIAGNOSIS — Z5181 Encounter for therapeutic drug level monitoring: Secondary | ICD-10-CM | POA: Diagnosis not present

## 2019-09-07 IMAGING — CT CT HEAR MORPH WITH CTA COR WITH SCORE WITH CA WITH CONTRAST AND
2 of 10 series · 8 of 20 positions shown, 9 images · IV contrast (APPLIED)
Comparison: None.
COMPARISON: None.

Addendum:
EXAM:
OVER-READ INTERPRETATION  CT CHEST

The following report is an over-read performed by radiologist Dr.
Linyunga Gisbert [REDACTED] on 01/10/2019. This over-read
does not include interpretation of cardiac or coronary anatomy or
pathology. The cardiac CTA interpretation by the cardiologist is
attached.
CLINICAL DATA: Aortic stenosis
Cardiac TAVR CT
TECHNIQUE: The patient was scanned on a Siemens Force [REDACTED]ice scanner. A 120
kV retrospective scan was triggered in the descending thoracic aorta
at 111 HU's. Gantry rotation speed was 270 msecs and collimation was
.9 mm. No beta blockade or nitro were given. The 3D data set was
reconstructed in 5% intervals of the R-R cycle. Systolic and
diastolic phases were analyzed on a dedicated work station using
MPR, MIP and VRT modes. The patient received 80 cc of contrast.

[Series 8: 0-90% · axial · 0.39mm/px · z∈[+1166,+1291]mm · 4 of 3470 slices shown, 5 images]
[im 694/3470  vessel]
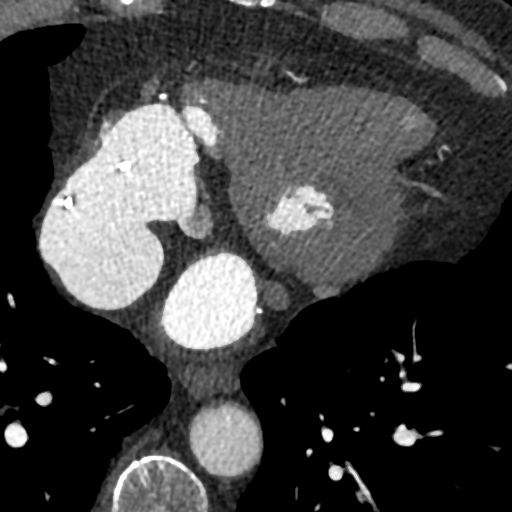
[im 694/3470  lung]
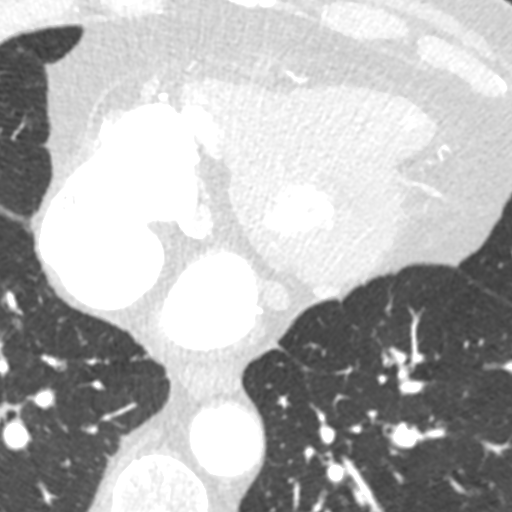
[im 1388/3470  vessel]
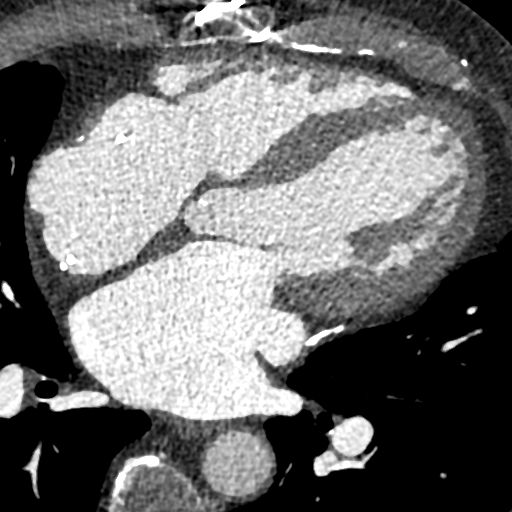
[im 2082/3470  vessel]
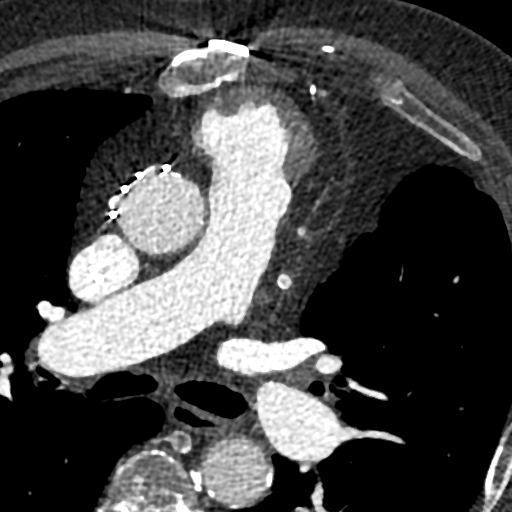
[im 2776/3470  vessel]
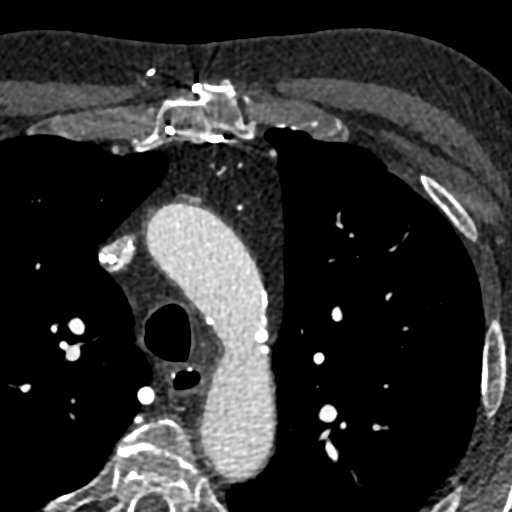

[Series 9: 5-95% · axial · 0.39mm/px · z∈[+1166,+1291]mm · 4 of 3470 slices shown]
[im 694/3470  vessel]
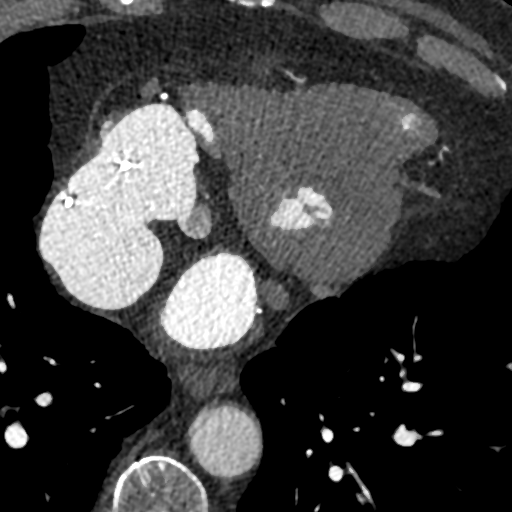
[im 1388/3470  vessel]
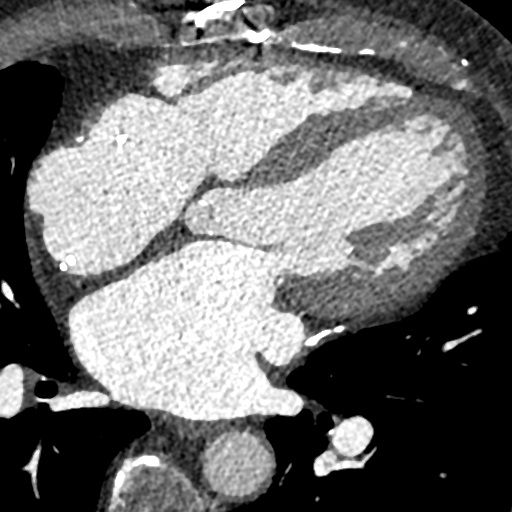
[im 2082/3470  vessel]
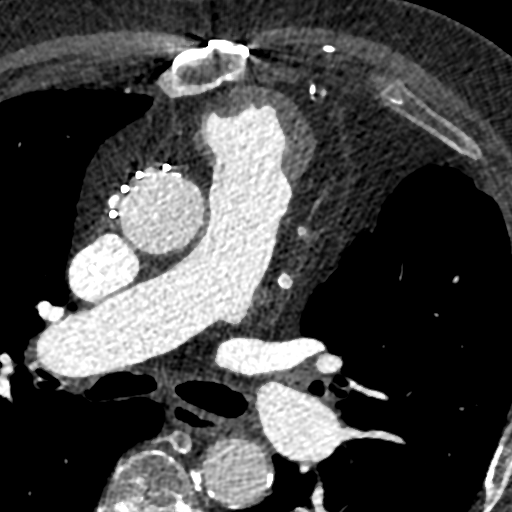
[im 2776/3470  vessel]
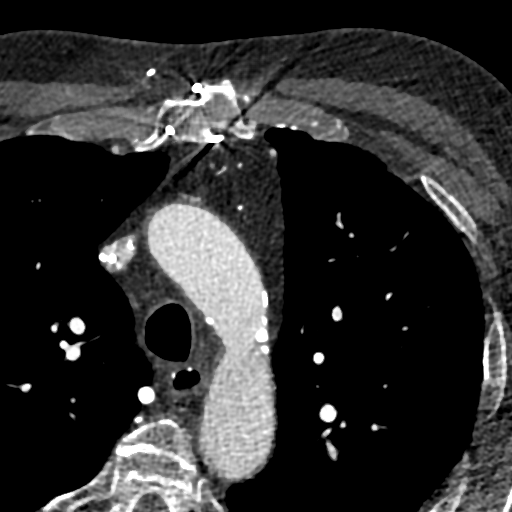

[8 of 20 positions shown; findings below may reference images not displayed]

FINDINGS: Please see the separate concurrent chest CT angiogram report for
details.
IMPRESSION: Please see the separate concurrent chest CT angiogram report for
details.
FINDINGS: Aortic Valve: Tri leaflet and calcified with restricted leaflet
motion RCC particularly calcified

Aorta: Moderate calcific atherosclerosis with no aneurysm

Sinotubular Junction: 26 mm There is dense calcification of the STJ
especially near the RCA ostium where it is bulky and nodular

Ascending Thoracic Aorta: 32 mm

Aortic Arch: 31 mm

Descending Thoracic Aorta: 26 mm

Sinus of Valsalva Measurements:

Non-coronary: 30.3 mm

Right - coronary: 28.7 mm

Left - coronary: 31.8 mm

Coronary Artery Height above Annulus:

Left Main: 16.4 mm above annulus

Right Coronary: 15.6 mm above annulus

Virtual Basal Annulus Measurements:

Maximum/Minimum Diameter: 32.5 mm x 22.7 mm

Perimeter: 91 mm

Area: 614 mm

Coronary Arteries: Sufficient height above annulus for deployment
but native LM and RCA occluded. Occluded GARZA to RCA. Patent [REDACTED] to
diagonal, Patent GARZA to OM1 and patent GARZA to mid LAD

Optimum Fluoroscopic Angle for Delivery: LAO 8 Caudal 1 degree
IMPRESSION: 1. Calcified tri leaflet AV with annular area of 614 mm2 This is
suitable for a 29 mm Sapien 3 valve However the dense nodular STJ
calcium and relatively small sinuses may make deployment more
difficult

2. Coronary arteries sufficient height above annulus for deployment
but native RCA/LM occluded Patent GARZA to OM1, GARZA to mid LAD Patent
[REDACTED] to D1 The native RCA and GARZA to RCA are occluded

3. Optimum angiographic angle for deployment LAO 8 degrees Caudal 1
degree

4.  Cannot r/o thrombus in DEN no delayed images performed

5. Degraded images with pacing wire artifact and motion artifact but
diagnostic for annular area

Hushbaht Sepp

*** End of Addendum ***
EXAM:
OVER-READ INTERPRETATION  CT CHEST

The following report is an over-read performed by radiologist Dr.
Linyunga Gisbert [REDACTED] on 01/10/2019. This over-read
does not include interpretation of cardiac or coronary anatomy or
pathology. The cardiac CTA interpretation by the cardiologist is
attached.
FINDINGS: Please see the separate concurrent chest CT angiogram report for
details.
IMPRESSION: Please see the separate concurrent chest CT angiogram report for
details.

## 2019-09-09 DIAGNOSIS — I4891 Unspecified atrial fibrillation: Secondary | ICD-10-CM | POA: Diagnosis not present

## 2019-09-09 DIAGNOSIS — Z7901 Long term (current) use of anticoagulants: Secondary | ICD-10-CM | POA: Diagnosis not present

## 2019-09-09 DIAGNOSIS — Z5181 Encounter for therapeutic drug level monitoring: Secondary | ICD-10-CM | POA: Diagnosis not present

## 2019-10-04 DIAGNOSIS — Z7901 Long term (current) use of anticoagulants: Secondary | ICD-10-CM | POA: Diagnosis not present

## 2019-10-04 DIAGNOSIS — I4891 Unspecified atrial fibrillation: Secondary | ICD-10-CM | POA: Diagnosis not present

## 2019-10-04 DIAGNOSIS — Z5181 Encounter for therapeutic drug level monitoring: Secondary | ICD-10-CM | POA: Diagnosis not present

## 2019-10-09 DIAGNOSIS — Z95810 Presence of automatic (implantable) cardiac defibrillator: Secondary | ICD-10-CM | POA: Diagnosis not present

## 2019-10-09 DIAGNOSIS — I255 Ischemic cardiomyopathy: Secondary | ICD-10-CM | POA: Diagnosis not present

## 2019-10-09 DIAGNOSIS — Z4502 Encounter for adjustment and management of automatic implantable cardiac defibrillator: Secondary | ICD-10-CM | POA: Diagnosis not present

## 2019-10-09 DIAGNOSIS — I472 Ventricular tachycardia: Secondary | ICD-10-CM | POA: Diagnosis not present

## 2019-10-09 DIAGNOSIS — Z452 Encounter for adjustment and management of vascular access device: Secondary | ICD-10-CM | POA: Diagnosis not present

## 2019-11-01 DIAGNOSIS — I4891 Unspecified atrial fibrillation: Secondary | ICD-10-CM | POA: Diagnosis not present

## 2019-11-01 DIAGNOSIS — Z7901 Long term (current) use of anticoagulants: Secondary | ICD-10-CM | POA: Diagnosis not present

## 2019-11-01 DIAGNOSIS — Z5181 Encounter for therapeutic drug level monitoring: Secondary | ICD-10-CM | POA: Diagnosis not present

## 2019-11-29 DIAGNOSIS — Z7901 Long term (current) use of anticoagulants: Secondary | ICD-10-CM | POA: Diagnosis not present

## 2019-11-29 DIAGNOSIS — Z5181 Encounter for therapeutic drug level monitoring: Secondary | ICD-10-CM | POA: Diagnosis not present

## 2019-11-29 DIAGNOSIS — I4891 Unspecified atrial fibrillation: Secondary | ICD-10-CM | POA: Diagnosis not present

## 2019-12-26 DIAGNOSIS — R413 Other amnesia: Secondary | ICD-10-CM | POA: Diagnosis not present

## 2019-12-26 DIAGNOSIS — G3184 Mild cognitive impairment, so stated: Secondary | ICD-10-CM | POA: Diagnosis not present

## 2019-12-26 DIAGNOSIS — I10 Essential (primary) hypertension: Secondary | ICD-10-CM | POA: Diagnosis not present

## 2019-12-26 DIAGNOSIS — H9193 Unspecified hearing loss, bilateral: Secondary | ICD-10-CM | POA: Diagnosis not present

## 2019-12-26 DIAGNOSIS — R454 Irritability and anger: Secondary | ICD-10-CM | POA: Diagnosis not present

## 2019-12-26 DIAGNOSIS — Z7289 Other problems related to lifestyle: Secondary | ICD-10-CM | POA: Diagnosis not present

## 2019-12-26 DIAGNOSIS — I798 Other disorders of arteries, arterioles and capillaries in diseases classified elsewhere: Secondary | ICD-10-CM | POA: Diagnosis not present

## 2019-12-26 DIAGNOSIS — G479 Sleep disorder, unspecified: Secondary | ICD-10-CM | POA: Diagnosis not present

## 2019-12-26 DIAGNOSIS — E785 Hyperlipidemia, unspecified: Secondary | ICD-10-CM | POA: Diagnosis not present

## 2019-12-26 DIAGNOSIS — R0683 Snoring: Secondary | ICD-10-CM | POA: Diagnosis not present

## 2019-12-26 DIAGNOSIS — Z952 Presence of prosthetic heart valve: Secondary | ICD-10-CM | POA: Diagnosis not present

## 2019-12-27 DIAGNOSIS — I4891 Unspecified atrial fibrillation: Secondary | ICD-10-CM | POA: Diagnosis not present

## 2019-12-27 DIAGNOSIS — Z5181 Encounter for therapeutic drug level monitoring: Secondary | ICD-10-CM | POA: Diagnosis not present

## 2019-12-27 DIAGNOSIS — Z7901 Long term (current) use of anticoagulants: Secondary | ICD-10-CM | POA: Diagnosis not present

## 2020-01-03 DIAGNOSIS — R69 Illness, unspecified: Secondary | ICD-10-CM | POA: Diagnosis not present

## 2020-01-03 DIAGNOSIS — R0683 Snoring: Secondary | ICD-10-CM | POA: Diagnosis not present

## 2020-01-03 DIAGNOSIS — Z952 Presence of prosthetic heart valve: Secondary | ICD-10-CM | POA: Diagnosis not present

## 2020-01-03 DIAGNOSIS — I69914 Frontal lobe and executive function deficit following unspecified cerebrovascular disease: Secondary | ICD-10-CM | POA: Diagnosis not present

## 2020-01-03 DIAGNOSIS — Z712 Person consulting for explanation of examination or test findings: Secondary | ICD-10-CM | POA: Diagnosis not present

## 2020-01-03 DIAGNOSIS — I69918 Other symptoms and signs involving cognitive functions following unspecified cerebrovascular disease: Secondary | ICD-10-CM | POA: Diagnosis not present

## 2020-01-03 DIAGNOSIS — R918 Other nonspecific abnormal finding of lung field: Secondary | ICD-10-CM | POA: Diagnosis not present

## 2020-01-03 DIAGNOSIS — F0151 Vascular dementia with behavioral disturbance: Secondary | ICD-10-CM | POA: Diagnosis not present

## 2020-01-08 DIAGNOSIS — Z95810 Presence of automatic (implantable) cardiac defibrillator: Secondary | ICD-10-CM | POA: Diagnosis not present

## 2020-01-08 DIAGNOSIS — R69 Illness, unspecified: Secondary | ICD-10-CM | POA: Diagnosis not present

## 2020-01-08 DIAGNOSIS — I255 Ischemic cardiomyopathy: Secondary | ICD-10-CM | POA: Diagnosis not present

## 2020-01-08 DIAGNOSIS — I350 Nonrheumatic aortic (valve) stenosis: Secondary | ICD-10-CM | POA: Diagnosis not present

## 2020-01-08 DIAGNOSIS — I472 Ventricular tachycardia: Secondary | ICD-10-CM | POA: Diagnosis not present

## 2020-01-08 DIAGNOSIS — Z5181 Encounter for therapeutic drug level monitoring: Secondary | ICD-10-CM | POA: Diagnosis not present

## 2020-01-08 DIAGNOSIS — I501 Left ventricular failure: Secondary | ICD-10-CM | POA: Diagnosis not present

## 2020-01-08 DIAGNOSIS — M4856XS Collapsed vertebra, not elsewhere classified, lumbar region, sequela of fracture: Secondary | ICD-10-CM | POA: Diagnosis not present

## 2020-01-08 DIAGNOSIS — G3184 Mild cognitive impairment, so stated: Secondary | ICD-10-CM | POA: Diagnosis not present

## 2020-01-08 DIAGNOSIS — T827XXD Infection and inflammatory reaction due to other cardiac and vascular devices, implants and grafts, subsequent encounter: Secondary | ICD-10-CM | POA: Diagnosis not present

## 2020-01-08 DIAGNOSIS — Z0000 Encounter for general adult medical examination without abnormal findings: Secondary | ICD-10-CM | POA: Diagnosis not present

## 2020-01-08 DIAGNOSIS — I4891 Unspecified atrial fibrillation: Secondary | ICD-10-CM | POA: Diagnosis not present

## 2020-01-08 DIAGNOSIS — I2510 Atherosclerotic heart disease of native coronary artery without angina pectoris: Secondary | ICD-10-CM | POA: Diagnosis not present

## 2020-01-09 DIAGNOSIS — Z0000 Encounter for general adult medical examination without abnormal findings: Secondary | ICD-10-CM | POA: Diagnosis not present

## 2020-01-21 NOTE — Progress Notes (Signed)
[facility]   MULTIDISCIPLINARY HEART VALVE CLINIC                                       Cardiology Office Note    Date:  01/23/2020   ID:  Derrick Hill, DOB 05-08-1935, MRN [id number]  PCP:  Day, Pernell Dupre, MD  Cardiologist: Dr. Andee Poles [place], [facility])/ Dr. Clifton James & Dr. Laneta Simmers (TAVR)  CC: 1 year s/p TAVR   History of Present Illness:  Derrick Hill is a 85 y.o. male with a history of HTN, hyperlipidemia, PAF on Coumadin,ischemic cardiomyopathy and chronic systolic heart failure, CADs/p CABG x 4(LIMA to LAD, SVG to ramus, SVG to Diagonal, SVG to PDA) at [facility], PCI of the OM1 in 2014 with DES, NSVT s/p ICD which had to be removed and replaced in 2016 due to enterococcal bacteremia and severeAS s/p TAVR (01/22/19) who presents for follow up.  Derrick Hill has has known severe AS.He was seen by Dr. Adella Hare at [facility] and underwent TAVR workup in 2015 but did not proceed with TAVR because he was told that it may not improve his symptoms. He has been followed at the [facility] in [place] since then. An echo in 04/2018 showed an EF of 35-45% with thickened and calcified aortic valve leaflets and a mean gradient of 27 mm Hg. The AVA was 0.44 cm2 with a DI of 0.15. He was referred to Dr. Clifton James for another opinion concerning the benefit of TAVR. Cardiac cath on 11/23/2018 showed severe 3-vessel CAD with 3/4 patent grafts. The mean gradient across the aortic valve was 8.6 mm Hg with a peak to peak gradient of 14. CI was 2.0. PA pressure at that time was 75/33 with a mean of 48 and an LVEDP of 25. Echo on 01/10/2019 showed a mean gradient of 19.8 mm Hg witha peak of 37.1 mm Hg with an AVA of 0.53 cm2. His LVEF has decreased to 25%.  He underwentsuccessful TAVR with a73mm Edwards Sapien 3 THV via theleft axillary arteryapproach on 01/22/19. Post operative echoshowed EF 30-35% with normally functioning TAVR w/ mean gradient of 5 mm hg and no PVL. He was discharged on  home on his chronic coumadin and aspirin (which was continued on). 1 month echo showed improvement In EF to 40-45%, normally functioning TAVR with mean gradient 6 mm Hg and trivial PVL.  Today he presents to clinic for follow up. He is here with his wife. He is doing quite well. No CP or SOB. No LE edema, orthopnea or PND. No dizziness or syncope. No blood in stool or urine. No palpitations.    Past Medical History:  Diagnosis Date  . AICD (automatic cardioverter/defibrillator) present   . Atrial fibrillation, chronic (HCC)    s/p AV node ablation   . BPH (benign prostatic hyperplasia)   . Cataract   . Compression of lumbar vertebra (HCC)   . Coronary artery disease   . ED (erectile dysfunction)   . Elevated prostate specific antigen (PSA)   . GERD (gastroesophageal reflux disease)   . Glaucoma primary, open angle    BOTH EYES  . H/O ventricular tachycardia    s/p ICD  . Hepatitis C virus infection   . History of discitis   . Hyperlipidemia   . Hypertension   . Ischemic cardiomyopathy   . Pre-diabetes   . Retinal detachment   . S/P  TAVR (transcatheter aortic valve replacement)   . Severe aortic stenosis   . Systolic CHF, chronic (HCC)     Past Surgical History:  Procedure Laterality Date  . AV NODE ABLATION    . CARDIAC CATHETERIZATION    . CATARACT EXTRACTION    . CHOLECYSTECTOMY    . CORONARY ANGIOPLASTY WITH STENT PLACEMENT    . CORONARY ARTERY BYPASS GRAFT    . INSERT / REPLACE / REMOVE PACEMAKER    . RIGHT/LEFT HEART CATH AND CORONARY/GRAFT ANGIOGRAPHY N/A 11/23/2018   Procedure: RIGHT/LEFT HEART CATH AND CORONARY/GRAFT ANGIOGRAPHY;  Surgeon: Kathleene Hazel, MD;  Location: [facility] INVASIVE CV LAB;  Service: Cardiovascular;  Laterality: N/A;  . SKIN BIOPSY    . TEE WITHOUT CARDIOVERSION N/A 01/22/2019   Procedure: TRANSESOPHAGEAL ECHOCARDIOGRAM (TEE);  Surgeon: Kathleene Hazel, MD;  Location: [facility] OR;  Service: Open Heart Surgery;  Laterality: N/A;  .  TRANSCATHETER AORTIC VALVE REPLACEMENT, TRANSFEMORAL N/A 01/22/2019   Procedure: LEFT GROIN LINE ACCESS;  Surgeon: Kathleene Hazel, MD;  Location: [facility] OR;  Service: Open Heart Surgery;  Laterality: N/A;  . YAG LASER APPLICATION      Current Medications: Outpatient Medications Prior to Visit  Medication Sig Dispense Refill  . amoxicillin (AMOXIL) 500 MG tablet Take 4 tablets by mouth 60 minutes prior to dental appointments 4 tablet 3  . aspirin EC 81 MG tablet Take 81 mg by mouth daily.    . brimonidine (ALPHAGAN) 0.2 % ophthalmic solution Place 1 drop into both eyes 2 (two) times a day.     . carvedilol (COREG) 12.5 MG tablet Take 12.5 mg by mouth 2 (two) times daily with a meal.     . Cholecalciferol (VITAMIN D) 50 MCG (2000 UT) CAPS Take 2,000 Units by mouth every other day.     . dorzolamide (TRUSOPT) 2 % ophthalmic solution Place 1 drop into both eyes 3 (three) times daily.    Marland Kitchen escitalopram (LEXAPRO) 10 MG tablet Take 1 tablet by mouth daily.    . furosemide (LASIX) 40 MG tablet Take 40 mg by mouth daily.     Marland Kitchen latanoprost (XALATAN) 0.005 % ophthalmic solution Place 1 drop into both eyes at bedtime.    Marland Kitchen lisinopril (PRINIVIL,ZESTRIL) 40 MG tablet Take 40 mg by mouth every morning.     . magnesium oxide (MAG-OX) 400 MG tablet Take 400 mg by mouth every other day.    . Multiple Vitamin (MULTIVITAMIN) tablet Take 1 tablet by mouth daily.    . nitroGLYCERIN (NITROSTAT) 0.4 MG SL tablet Place 0.4 mg under the tongue every 5 (five) minutes as needed for chest pain.    . pravastatin (PRAVACHOL) 80 MG tablet Take 40 mg by mouth at bedtime.     . ranolazine (RANEXA) 500 MG 12 hr tablet Take 500 mg by mouth 2 (two) times daily.    Marland Kitchen warfarin (COUMADIN) 5 MG tablet Take 2.5-5 mg by mouth See admin instructions. 5 mg M Thur, 2.5 mg all other days     No facility-administered medications prior to visit.     Allergies:   Anesthetics, ester   Social History   Socioeconomic History  .  Marital status: Married    Spouse name: Not on file  . Number of children: Not on file  . Years of education: Not on file  . Highest education level: Not on file  Occupational History  . Occupation: Retired-accountant  Tobacco Use  . Smoking status: Former Smoker  Packs/day: 2.00    Years: 40.00    Pack years: 80.00    Types: Cigarettes    Quit date: 09/06/1983    Years since quitting: 36.4  . Smokeless tobacco: Never Used  Substance and Sexual Activity  . Alcohol use: Yes    Alcohol/week: 2.0 standard drinks    Types: 1 Glasses of wine, 1 Cans of beer per week  . Drug use: Not on file  . Sexual activity: Not on file  Other Topics Concern  . Not on file  Social History Narrative  . Not on file   Social Determinants of Health   Financial Resource Strain:   . Difficulty of Paying Living Expenses:   Food Insecurity:   . Worried About Charity fundraiser in the Last Year:   . Arboriculturist in the Last Year:   Transportation Needs:   . Film/video editor (Medical):   Marland Kitchen Lack of Transportation (Non-Medical):   Physical Activity:   . Days of Exercise per Week:   . Minutes of Exercise per Session:   Stress:   . Feeling of Stress :   Social Connections:   . Frequency of Communication with Friends and Family:   . Frequency of Social Gatherings with Friends and Family:   . Attends Religious Services:   . Active Member of Clubs or Organizations:   . Attends Archivist Meetings:   Marland Kitchen Marital Status:      Family History:  The patient's family history includes Cancer - Other in his sister.     ROS:   Please see the history of present illness.    ROS All other systems reviewed and are negative.   PHYSICAL EXAM:   VS:  BP 135/84   Pulse 86   Ht 5\' 6"  (1.676 m)   Wt 179 lb 1.9 oz (81.2 kg)   SpO2 98%   BMI 28.91 kg/m    GEN: Well nourished, well developed, in no acute distress HEENT: normal Neck: no JVD or masses Cardiac: RRR; no murmurs, rubs, or  gallops,no edema  Respiratory:  clear to auscultation bilaterally, normal work of breathing GI: soft, nontender, nondistended, + BS MS: no deformity or atrophy Skin: warm and dry, no rash Neuro:  Alert and Oriented x 3, Strength and sensation are intact Psych: euthymic mood, full affect   Wt Readings from Last 3 Encounters:  01/23/20 179 lb 1.9 oz (81.2 kg)  02/13/19 175 lb (79.4 kg)  01/30/19 174 lb (78.9 kg)      Studies/Labs Reviewed:   EKG:  EKG is ordered today.  The ekg ordered today demonstrates V paced HR 86 bpm  Recent Labs: No results found for requested labs within last 8760 hours.   Lipid Panel No results found for: CHOL, TRIG, HDL, CHOLHDL, VLDL, LDLCALC, LDLDIRECT  Additional studies/ records that were reviewed today include:  TAVR OPERATIVE NOTE   Date of Procedure:01/22/2019  Preoperative Diagnosis:Severe Aortic Stenosis   Postoperative Diagnosis:Same   Procedure:  Transcatheter Aortic Valve Replacement -Left axillary artery approach  Edwards Sapien 3 THV (size 57mm, model # 9600TFX, serial # [id number])  Co-Surgeons:Bryan Alveria Apley, MD and Lauree Chandler, MD  Anesthesiologist:Kevin Smith Robert, MD  Echocardiographer:Peter Johnsie Cancel, MD  Pre-operative Echo Findings: ? Severe aortic stenosis ? Severeleft ventricular systolic dysfunction  Post-operative Echo Findings: ? trivialparavalvular leak ? Unchanged severeleft ventricular systolic dysfunction  _____________   Echo 01/23/19: IMPRESSIONS 1. The left ventricle has moderate-severely reduced systolic function, with an ejection  fraction of 30-35%. There is mildly increased left ventricular wall thickness. Left ventricular diastolic Doppler parameters are consistent with restrictive filling. Elevated mean left atrial pressure Left ventricular diffuse hypokinesis. 2. Hypokinesis worse in the  inferior and inferoseptal myocardium. 3. No evidence of mitral valve stenosis. 4. No stenosis of the aortic valve. 5. The ascending aorta is normal in size and structure. 6. - TAVR: 29 mm Edwards Sapien 3 TAVR bioprosthesis present and functioning properly. Peak velocity 1.7 m/s. Mean gradient 5 mmHg.  ______________   Echo 02/12/19 IMPRESSIONS 1. The left ventricle has mild-moderately reduced systolic function, with an ejection fraction of 40-45%. The cavity size was normal. There is moderate concentric left ventricular hypertrophy. Left ventricular diastolic function could not be evaluated  secondary to atrial fibrillation. There is abnormal septal motion consistent with RV pacemaker. Left ventricular diffuse hypokinesis. 2. The right ventricle has mildly reduced systolic function. The cavity was mildly enlarged. There is no increase in right ventricular wall thickness. Right ventricular systolic pressure is mildly elevated with an estimated pressure of 52.8 mmHg. 3. Left atrial size was severely dilated. 4. Right atrial size was severely dilated. 5. Mild thickening of the mitral valve leaflet. There is mild mitral annular calcification present. Mild Derrick. The Derrick jet is centrally-directed. 6. A 17mm an [place] Sapien bioprosthetic aortic valve (TAVR) valve is present in the aortic position. Peak and mean gradients 12 and 6 mm Hg, not significantly changed from 01/23/2019. 7. Trivial TAVR perivalvular leak. 8. The ascending aorta is normal in size and structure.  _________________   Echo 01/23/20 IMPRESSIONS  1. Left ventricular ejection fraction, by estimation, is 40 to 45%. The left ventricle has mildly decreased function. The left ventricle has no regional wall motion abnormalities. There is mild concentric left ventricular hypertrophy. Left ventricular  diastolic parameters are indeterminate.  2. Right ventricular systolic function is normal. The right ventricular  size is normal. There is severely elevated pulmonary artery systolic pressure.  3. Left atrial size was severely dilated.  4. Right atrial size was severely dilated.  5. The mitral valve is normal in structure. No evidence of mitral valve regurgitation. No evidence of mitral stenosis.  6. The aortic valve has been repaired/replaced. Aortic valve regurgitation is not visualized. No aortic stenosis is present. There is a 29 mm Edwards Sapien prosthetic (TAVR) valve present in the aortic position. Procedure Date: 01/22/19. Echo findings  are consistent with normal structure and function of the aortic valve prosthesis. Aortic valve area, by VTI measures 2.80 cm. Aortic valve mean gradient measures 5.0 mmHg. Aortic valve Vmax measures 1.52 m/s.  7. Aortic dilatation noted. There is mild dilatation of the ascending aorta measuring 37 mm.  8. The inferior vena cava is dilated in size with >50% respiratory variability, suggesting right atrial pressure of 8 mmHg.  Comparison(s): 02/12/19 EF 40-45%. AV 12 mmHg peak PG, 6 mmHg mean PG.  __________________  CT 01/21/20 IMPRESSION: 1. Stable 3 mm RIGHT upper lobe pulmonary nodule. 2. Small 2-3 mm nodule in the LEFT upper lobe not imaged on the prior study. No suspicious pulmonary nodule. 3. Small ill-defined nodules seen on the prior study have resolved. 4. Aortic atherosclerosis.   ASSESSMENT & PLAN:   Severe AS s/p TAVR: doing well with NYHA class II symptoms. echo today shows EF 40-45%, normally functioning TAVR with a mean gradient of 5 mmhg and no PVL. He has been continued on his chronic Coumadin and aspirin. He will continue regular follow up with  his cardiologist at the [facility].  Incidental findings: pre TAVR scan showed the following, which will be followed in the outpatient setting:   Ectatic 2.5 cm infrarenal abdominal aorta, at risk for aneurysm development-->US in 5 years.   Scattered small solid pulmonary nodules in both lungs-->CT 1  year if high risk (former smoker). CT today showed stable 3 mm right upper lobe pulmonary nodule, small 2-3 mm nodule in the left upper lobe not imaged on the prior study, no suspicious pulmonary nodule and small ill-defined nodules seen on the prior study have resolved. No further work up recommended.  Finely irregular liver surface, cannot exclude hepatic cirrhosis. Consider hepatic elastography for further liver fibrosis risk stratification, as clinically warranted. Of note, hi s bilirubin was elevated to 2.6 and AST 62. This is being followed by PCP  Medication Adjustments/Labs and Tests Ordered: Current medicines are reviewed at length with the patient today.  Concerns regarding medicines are outlined above.  Medication changes, Labs and Tests ordered today are listed in the Patient Instructions below. Patient Instructions  [facility]    Signed, Cline Crock, PA-C  01/23/2020 9:25 PM    [facility] [address] [place], [place], [place]  [address] Phone: [phone]; Fax: [phone]

## 2020-01-23 ENCOUNTER — Ambulatory Visit: Payer: Medicare HMO | Admitting: Physician Assistant

## 2020-01-23 ENCOUNTER — Ambulatory Visit (INDEPENDENT_AMBULATORY_CARE_PROVIDER_SITE_OTHER)
Admission: RE | Admit: 2020-01-23 | Discharge: 2020-01-23 | Disposition: A | Discharge status: Home / Self Care | Payer: Medicare HMO | Source: Ambulatory Visit | Attending: Physician Assistant | Admitting: Physician Assistant

## 2020-01-23 ENCOUNTER — Ambulatory Visit (HOSPITAL_COMMUNITY): Payer: Medicare HMO | Attending: Physician Assistant

## 2020-01-23 ENCOUNTER — Other Ambulatory Visit: Payer: Self-pay

## 2020-01-23 ENCOUNTER — Encounter: Payer: Self-pay | Admitting: Physician Assistant

## 2020-01-23 VITALS — BP 135/84 | HR 86 | Ht 66.0 in | Wt 179.1 lb

## 2020-01-23 DIAGNOSIS — Z952 Presence of prosthetic heart valve: Secondary | ICD-10-CM

## 2020-01-23 DIAGNOSIS — R918 Other nonspecific abnormal finding of lung field: Secondary | ICD-10-CM | POA: Diagnosis not present

## 2020-01-23 MED ORDER — PERFLUTREN LIPID MICROSPHERE
1.0000 mL | INTRAVENOUS | Status: AC | PRN
  Administered 2020-01-23: 1 mL via INTRAVENOUS

## 2020-01-23 NOTE — Patient Instructions (Signed)
[facility]!

## 2020-01-24 DIAGNOSIS — Z7901 Long term (current) use of anticoagulants: Secondary | ICD-10-CM | POA: Diagnosis not present

## 2020-01-24 DIAGNOSIS — I4891 Unspecified atrial fibrillation: Secondary | ICD-10-CM | POA: Diagnosis not present

## 2020-01-24 DIAGNOSIS — Z5181 Encounter for therapeutic drug level monitoring: Secondary | ICD-10-CM | POA: Diagnosis not present

## 2020-01-29 DIAGNOSIS — H903 Sensorineural hearing loss, bilateral: Secondary | ICD-10-CM | POA: Diagnosis not present

## 2020-02-10 DIAGNOSIS — Z008 Encounter for other general examination: Secondary | ICD-10-CM | POA: Diagnosis not present

## 2020-02-10 DIAGNOSIS — I509 Heart failure, unspecified: Secondary | ICD-10-CM | POA: Diagnosis not present

## 2020-02-10 DIAGNOSIS — I2510 Atherosclerotic heart disease of native coronary artery without angina pectoris: Secondary | ICD-10-CM | POA: Diagnosis not present

## 2020-02-10 DIAGNOSIS — D6869 Other thrombophilia: Secondary | ICD-10-CM | POA: Diagnosis not present

## 2020-02-10 DIAGNOSIS — R69 Illness, unspecified: Secondary | ICD-10-CM | POA: Diagnosis not present

## 2020-02-10 DIAGNOSIS — E785 Hyperlipidemia, unspecified: Secondary | ICD-10-CM | POA: Diagnosis not present

## 2020-02-10 DIAGNOSIS — I739 Peripheral vascular disease, unspecified: Secondary | ICD-10-CM | POA: Diagnosis not present

## 2020-02-10 DIAGNOSIS — I110 Hypertensive heart disease with heart failure: Secondary | ICD-10-CM | POA: Diagnosis not present

## 2020-02-10 DIAGNOSIS — H409 Unspecified glaucoma: Secondary | ICD-10-CM | POA: Diagnosis not present

## 2020-02-10 DIAGNOSIS — I4891 Unspecified atrial fibrillation: Secondary | ICD-10-CM | POA: Diagnosis not present

## 2020-02-11 DIAGNOSIS — H903 Sensorineural hearing loss, bilateral: Secondary | ICD-10-CM | POA: Diagnosis not present

## 2020-02-11 DIAGNOSIS — H905 Unspecified sensorineural hearing loss: Secondary | ICD-10-CM | POA: Diagnosis not present

## 2020-02-12 DIAGNOSIS — Z79899 Other long term (current) drug therapy: Secondary | ICD-10-CM | POA: Diagnosis not present

## 2020-02-12 DIAGNOSIS — H401133 Primary open-angle glaucoma, bilateral, severe stage: Secondary | ICD-10-CM | POA: Diagnosis not present

## 2020-02-28 DIAGNOSIS — R791 Abnormal coagulation profile: Secondary | ICD-10-CM | POA: Diagnosis not present

## 2020-02-28 DIAGNOSIS — I4891 Unspecified atrial fibrillation: Secondary | ICD-10-CM | POA: Diagnosis not present

## 2020-02-28 DIAGNOSIS — Z5181 Encounter for therapeutic drug level monitoring: Secondary | ICD-10-CM | POA: Diagnosis not present

## 2020-02-28 DIAGNOSIS — Z7901 Long term (current) use of anticoagulants: Secondary | ICD-10-CM | POA: Diagnosis not present

## 2020-03-20 DIAGNOSIS — I4891 Unspecified atrial fibrillation: Secondary | ICD-10-CM | POA: Diagnosis not present

## 2020-03-20 DIAGNOSIS — Z5181 Encounter for therapeutic drug level monitoring: Secondary | ICD-10-CM | POA: Diagnosis not present

## 2020-03-20 DIAGNOSIS — Z7901 Long term (current) use of anticoagulants: Secondary | ICD-10-CM | POA: Diagnosis not present

## 2020-04-07 DIAGNOSIS — Z5181 Encounter for therapeutic drug level monitoring: Secondary | ICD-10-CM | POA: Diagnosis not present

## 2020-04-07 DIAGNOSIS — I4891 Unspecified atrial fibrillation: Secondary | ICD-10-CM | POA: Diagnosis not present

## 2020-04-07 DIAGNOSIS — Z7901 Long term (current) use of anticoagulants: Secondary | ICD-10-CM | POA: Diagnosis not present

## 2020-04-08 DIAGNOSIS — T827XXD Infection and inflammatory reaction due to other cardiac and vascular devices, implants and grafts, subsequent encounter: Secondary | ICD-10-CM | POA: Diagnosis not present

## 2020-04-08 DIAGNOSIS — Y832 Surgical operation with anastomosis, bypass or graft as the cause of abnormal reaction of the patient, or of later complication, without mention of misadventure at the time of the procedure: Secondary | ICD-10-CM | POA: Diagnosis not present

## 2020-04-08 DIAGNOSIS — I4891 Unspecified atrial fibrillation: Secondary | ICD-10-CM | POA: Diagnosis not present

## 2020-04-13 DIAGNOSIS — Z95810 Presence of automatic (implantable) cardiac defibrillator: Secondary | ICD-10-CM | POA: Diagnosis not present

## 2020-04-13 DIAGNOSIS — I255 Ischemic cardiomyopathy: Secondary | ICD-10-CM | POA: Diagnosis not present

## 2020-05-08 DIAGNOSIS — Z7901 Long term (current) use of anticoagulants: Secondary | ICD-10-CM | POA: Diagnosis not present

## 2020-05-08 DIAGNOSIS — Z5181 Encounter for therapeutic drug level monitoring: Secondary | ICD-10-CM | POA: Diagnosis not present

## 2020-05-08 DIAGNOSIS — I4891 Unspecified atrial fibrillation: Secondary | ICD-10-CM | POA: Diagnosis not present

## 2020-05-27 DIAGNOSIS — I4891 Unspecified atrial fibrillation: Secondary | ICD-10-CM | POA: Diagnosis not present

## 2020-05-27 DIAGNOSIS — Z5181 Encounter for therapeutic drug level monitoring: Secondary | ICD-10-CM | POA: Diagnosis not present

## 2020-05-27 DIAGNOSIS — R69 Illness, unspecified: Secondary | ICD-10-CM | POA: Diagnosis not present

## 2020-05-27 DIAGNOSIS — Z7901 Long term (current) use of anticoagulants: Secondary | ICD-10-CM | POA: Diagnosis not present

## 2020-05-27 DIAGNOSIS — Z952 Presence of prosthetic heart valve: Secondary | ICD-10-CM | POA: Diagnosis not present

## 2020-06-23 DIAGNOSIS — I4891 Unspecified atrial fibrillation: Secondary | ICD-10-CM | POA: Diagnosis not present

## 2020-06-23 DIAGNOSIS — Z7901 Long term (current) use of anticoagulants: Secondary | ICD-10-CM | POA: Diagnosis not present

## 2020-06-30 DIAGNOSIS — M4856XS Collapsed vertebra, not elsewhere classified, lumbar region, sequela of fracture: Secondary | ICD-10-CM | POA: Diagnosis not present

## 2020-06-30 DIAGNOSIS — G3184 Mild cognitive impairment, so stated: Secondary | ICD-10-CM | POA: Diagnosis not present

## 2020-06-30 DIAGNOSIS — I350 Nonrheumatic aortic (valve) stenosis: Secondary | ICD-10-CM | POA: Diagnosis not present

## 2020-06-30 DIAGNOSIS — R413 Other amnesia: Secondary | ICD-10-CM | POA: Diagnosis not present

## 2020-06-30 DIAGNOSIS — E7849 Other hyperlipidemia: Secondary | ICD-10-CM | POA: Diagnosis not present

## 2020-06-30 DIAGNOSIS — N401 Enlarged prostate with lower urinary tract symptoms: Secondary | ICD-10-CM | POA: Diagnosis not present

## 2020-06-30 DIAGNOSIS — I2510 Atherosclerotic heart disease of native coronary artery without angina pectoris: Secondary | ICD-10-CM | POA: Diagnosis not present

## 2020-06-30 DIAGNOSIS — Z952 Presence of prosthetic heart valve: Secondary | ICD-10-CM | POA: Diagnosis not present

## 2020-06-30 DIAGNOSIS — I4891 Unspecified atrial fibrillation: Secondary | ICD-10-CM | POA: Diagnosis not present

## 2020-06-30 DIAGNOSIS — Z7901 Long term (current) use of anticoagulants: Secondary | ICD-10-CM | POA: Diagnosis not present

## 2020-06-30 DIAGNOSIS — Z5181 Encounter for therapeutic drug level monitoring: Secondary | ICD-10-CM | POA: Diagnosis not present

## 2020-07-13 DIAGNOSIS — Z5181 Encounter for therapeutic drug level monitoring: Secondary | ICD-10-CM | POA: Diagnosis not present

## 2020-07-13 DIAGNOSIS — I4891 Unspecified atrial fibrillation: Secondary | ICD-10-CM | POA: Diagnosis not present

## 2020-07-13 DIAGNOSIS — Z7901 Long term (current) use of anticoagulants: Secondary | ICD-10-CM | POA: Diagnosis not present

## 2020-07-13 DIAGNOSIS — Z4502 Encounter for adjustment and management of automatic implantable cardiac defibrillator: Secondary | ICD-10-CM | POA: Diagnosis not present

## 2020-07-13 DIAGNOSIS — I255 Ischemic cardiomyopathy: Secondary | ICD-10-CM | POA: Diagnosis not present

## 2020-08-04 DIAGNOSIS — I4891 Unspecified atrial fibrillation: Secondary | ICD-10-CM | POA: Diagnosis not present

## 2020-08-04 DIAGNOSIS — Z5181 Encounter for therapeutic drug level monitoring: Secondary | ICD-10-CM | POA: Diagnosis not present

## 2020-08-04 DIAGNOSIS — Z7901 Long term (current) use of anticoagulants: Secondary | ICD-10-CM | POA: Diagnosis not present

## 2020-08-12 DIAGNOSIS — I255 Ischemic cardiomyopathy: Secondary | ICD-10-CM | POA: Diagnosis not present

## 2020-08-12 DIAGNOSIS — I4891 Unspecified atrial fibrillation: Secondary | ICD-10-CM | POA: Diagnosis not present

## 2020-08-13 DIAGNOSIS — Z4502 Encounter for adjustment and management of automatic implantable cardiac defibrillator: Secondary | ICD-10-CM | POA: Diagnosis not present

## 2020-08-13 DIAGNOSIS — I255 Ischemic cardiomyopathy: Secondary | ICD-10-CM | POA: Diagnosis not present

## 2020-08-17 DIAGNOSIS — T827XXD Infection and inflammatory reaction due to other cardiac and vascular devices, implants and grafts, subsequent encounter: Secondary | ICD-10-CM | POA: Diagnosis not present

## 2020-08-17 DIAGNOSIS — I4821 Permanent atrial fibrillation: Secondary | ICD-10-CM | POA: Diagnosis not present

## 2020-08-17 DIAGNOSIS — I350 Nonrheumatic aortic (valve) stenosis: Secondary | ICD-10-CM | POA: Diagnosis not present

## 2020-08-17 DIAGNOSIS — Z79899 Other long term (current) drug therapy: Secondary | ICD-10-CM | POA: Diagnosis not present

## 2020-08-17 DIAGNOSIS — I255 Ischemic cardiomyopathy: Secondary | ICD-10-CM | POA: Diagnosis not present

## 2020-08-17 DIAGNOSIS — I4891 Unspecified atrial fibrillation: Secondary | ICD-10-CM | POA: Diagnosis not present

## 2020-08-17 DIAGNOSIS — Z7901 Long term (current) use of anticoagulants: Secondary | ICD-10-CM | POA: Diagnosis not present

## 2020-08-17 DIAGNOSIS — Z950 Presence of cardiac pacemaker: Secondary | ICD-10-CM | POA: Diagnosis not present

## 2020-08-17 DIAGNOSIS — I495 Sick sinus syndrome: Secondary | ICD-10-CM | POA: Diagnosis not present

## 2020-08-17 DIAGNOSIS — I472 Ventricular tachycardia: Secondary | ICD-10-CM | POA: Diagnosis not present

## 2020-08-17 DIAGNOSIS — Z4502 Encounter for adjustment and management of automatic implantable cardiac defibrillator: Secondary | ICD-10-CM | POA: Diagnosis not present

## 2020-08-17 DIAGNOSIS — Z5181 Encounter for therapeutic drug level monitoring: Secondary | ICD-10-CM | POA: Diagnosis not present

## 2020-08-17 DIAGNOSIS — I493 Ventricular premature depolarization: Secondary | ICD-10-CM | POA: Diagnosis not present

## 2020-08-17 DIAGNOSIS — I447 Left bundle-branch block, unspecified: Secondary | ICD-10-CM | POA: Diagnosis not present

## 2020-08-17 DIAGNOSIS — Z7982 Long term (current) use of aspirin: Secondary | ICD-10-CM | POA: Diagnosis not present

## 2022-04-05 DEATH — deceased
